# Patient Record
Sex: Female | Born: 1990
Health system: Southern US, Community
[De-identification: ages and names within clinical notes are randomized; demographics above are authoritative.]

## PROBLEM LIST (undated history)

## (undated) DIAGNOSIS — IMO0002 Reserved for concepts with insufficient information to code with codable children: Secondary | ICD-10-CM

## (undated) DIAGNOSIS — R51 Headache: Secondary | ICD-10-CM

## (undated) DIAGNOSIS — T7840XA Allergy, unspecified, initial encounter: Secondary | ICD-10-CM

## (undated) DIAGNOSIS — R87619 Unspecified abnormal cytological findings in specimens from cervix uteri: Secondary | ICD-10-CM

## (undated) HISTORY — DX: Reserved for concepts with insufficient information to code with codable children: IMO0002

## (undated) HISTORY — DX: Unspecified abnormal cytological findings in specimens from cervix uteri: R87.619

## (undated) HISTORY — DX: Allergy, unspecified, initial encounter: T78.40XA

## (undated) HISTORY — PX: WISDOM TOOTH EXTRACTION: SHX21

## (undated) HISTORY — DX: Headache: R51

---
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
## 2009-07-12 ENCOUNTER — Ambulatory Visit: Payer: Self-pay | Admitting: Family Medicine

## 2009-07-12 ENCOUNTER — Encounter: Payer: Self-pay | Admitting: Family Medicine

## 2009-07-12 DIAGNOSIS — R5383 Other fatigue: Secondary | ICD-10-CM

## 2009-07-12 DIAGNOSIS — R5381 Other malaise: Secondary | ICD-10-CM | POA: Insufficient documentation

## 2009-07-16 ENCOUNTER — Encounter (INDEPENDENT_AMBULATORY_CARE_PROVIDER_SITE_OTHER): Payer: Self-pay | Admitting: *Deleted

## 2009-07-16 ENCOUNTER — Ambulatory Visit: Payer: Self-pay | Admitting: Family Medicine

## 2009-07-16 DIAGNOSIS — A5400 Gonococcal infection of lower genitourinary tract, unspecified: Secondary | ICD-10-CM | POA: Insufficient documentation

## 2009-07-16 LAB — CONVERTED CEMR LAB
ALT: 14 units/L (ref 0–35)
AST: 18 units/L (ref 0–37)
Albumin: 4.2 g/dL (ref 3.5–5.2)
Alkaline Phosphatase: 48 units/L (ref 39–117)
BUN: 11 mg/dL (ref 6–23)
Basophils Absolute: 0 10*3/uL (ref 0.0–0.1)
Calcium: 9.4 mg/dL (ref 8.4–10.5)
Chlamydia, DNA Probe: NEGATIVE
Chloride: 106 meq/L (ref 96–112)
Eosinophils Relative: 1 % (ref 0–5)
Folate: 12.2 ng/mL
GC Probe Amp, Genital: POSITIVE — AB
HCT: 38.7 % (ref 36.0–46.0)
HCV Ab: NEGATIVE
Hep A IgM: NEGATIVE
Lymphocytes Relative: 45 % (ref 12–46)
Lymphs Abs: 2.5 10*3/uL (ref 0.7–4.0)
Neutro Abs: 2.5 10*3/uL (ref 1.7–7.7)
Neutrophils Relative %: 46 % (ref 43–77)
Platelets: 302 10*3/uL (ref 150–400)
Potassium: 4.5 meq/L (ref 3.5–5.3)
RDW: 14.1 % (ref 11.5–15.5)
Sodium: 141 meq/L (ref 135–145)
Vitamin B-12: 324 pg/mL (ref 211–911)
WBC: 5.6 10*3/uL (ref 4.0–10.5)

## 2010-04-18 ENCOUNTER — Emergency Department: Payer: Self-pay | Admitting: Emergency Medicine

## 2010-05-20 ENCOUNTER — Emergency Department: Payer: Self-pay | Admitting: Emergency Medicine

## 2010-05-21 ENCOUNTER — Ambulatory Visit: Payer: Self-pay | Admitting: Obstetrics & Gynecology

## 2010-05-21 ENCOUNTER — Inpatient Hospital Stay (HOSPITAL_COMMUNITY)
Admission: AD | Admit: 2010-05-21 | Discharge: 2010-05-21 | Payer: Self-pay | Source: Home / Self Care | Admitting: Obstetrics & Gynecology

## 2010-10-30 ENCOUNTER — Inpatient Hospital Stay (HOSPITAL_COMMUNITY)
Admission: AD | Admit: 2010-10-30 | Discharge: 2010-10-30 | Payer: Self-pay | Source: Home / Self Care | Attending: Obstetrics and Gynecology | Admitting: Obstetrics and Gynecology

## 2010-11-01 ENCOUNTER — Inpatient Hospital Stay (HOSPITAL_COMMUNITY)
Admission: AD | Admit: 2010-11-01 | Discharge: 2010-11-01 | Payer: Self-pay | Source: Home / Self Care | Attending: Obstetrics and Gynecology | Admitting: Obstetrics and Gynecology

## 2010-11-02 ENCOUNTER — Inpatient Hospital Stay (HOSPITAL_COMMUNITY)
Admission: AD | Admit: 2010-11-02 | Discharge: 2010-11-05 | DRG: 372 | Disposition: A | Discharge status: Home / Self Care | Payer: BC Managed Care – PPO | Attending: Obstetrics and Gynecology | Admitting: Obstetrics and Gynecology

## 2010-11-02 LAB — CBC
MCH: 28.8 pg (ref 26.0–34.0)
MCV: 85.6 fL (ref 78.0–100.0)
Platelets: 231 10*3/uL (ref 150–400)
RDW: 14.8 % (ref 11.5–15.5)
WBC: 10.6 10*3/uL — ABNORMAL HIGH (ref 4.0–10.5)

## 2010-11-03 LAB — RPR: RPR Ser Ql: NONREACTIVE

## 2010-11-04 DIAGNOSIS — O9903 Anemia complicating the puerperium: Secondary | ICD-10-CM | POA: Diagnosis not present

## 2010-11-04 DIAGNOSIS — D649 Anemia, unspecified: Secondary | ICD-10-CM | POA: Diagnosis not present

## 2010-11-04 LAB — CBC
MCH: 28.8 pg (ref 26.0–34.0)
MCHC: 33.6 g/dL (ref 30.0–36.0)
Platelets: 211 10*3/uL (ref 150–400)

## 2010-11-04 NOTE — Assessment & Plan Note (Signed)
Summary: CPX W/PAP/RBH   Vital Signs:  Patient Profile:   20 Years Old Female LMP:     04/05/2008 Height:     63 inches Weight:      100.25 pounds Temp:     98.4 degrees F oral Pulse rate:   80 / minute Pulse rhythm:   regular BP sitting:   104 / 68  (left arm) Cuff size:   regular  Vitals Entered By: Delilah Shan (April 19, 2008 11:16 AM)  Menstrual History: LMP (date): 04/05/2008                 Chief Complaint:  CPX with Pap.  History of Present Illness: No pap in the past first sex age 32  on yaz, has helped the menstrual cramps, no SE currently sexually active uses condoms has had less than 3 partners in last year  always has vaginal diacharge before and after menses, somewhat stronger smelling and heavier, no itching no breast lumps and bumps     Current Allergies (reviewed today): No known allergies   Past Medical History:    Reviewed history from 08/02/2007 and no changes required:       allergic diathesis       headaches   Social History:    Reviewed history from 08/02/2007 and no changes required:       lives with mother and sister       no contact with dad       in grade 12th, [employer]       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  CV      Denies chest pains.  Resp      Denies dyspnea at rest and wheezing.  GI      Denies nausea, vomiting, diarrhea, constipation, and abdominal pain.  GU      Denies dysuria.    Problem # 1:  Well Adolescent Exam (ICD-V20.2 Reviewed preventive care protocols, scheduled due services, and updated immunizations.  Vaginal discharge ismost likely  physiologic. Awaiting GC and Chlam results as well as other STD screening. Contiue  YAz.  Problem # 2:  SCREENING EXAMINATION FOR VENEREAL DISEASE (ICD-V74.5)  Orders: T-HIV Antibody  (Reflex) ([id number]) T-Hepatitis Acute Panel (09811-91478) T-RPR (Syphilis) (29562-13086)     ] Current Allergies (reviewed today): No known allergies  Current Medications (including changes made in today's visit):  DRYSOL 20 %  SOLN (ALUMINUM CHLORIDE) AAA qHS YAZ 3-0.02 MG  TABS (DROSPIRENONE-ETHINYL ESTRADIOL) Take 1 tablet by mouth once a day WOMENS HAIR, SKIN & NAILS   TABS (MULTIPLE VITAMINS-MINERALS) Take 1 tablet by mouth once a day    Laboratory Results    Wet [place]/KOH Source: vaginal WBC/hpf 0 Bacteria/hpf 0 Clue cells/hpf 0 Yeast/hpf 0 KOH 0 Trichomonas/hpf 0

## 2010-11-04 NOTE — Assessment & Plan Note (Signed)
Summary: SHOTS AND FORM FILLED OUT/DLO   Vital Signs:  Patient Profile:   20 Years Old Female Height:     63 inches Weight:      101.38 pounds Temp:     97.7 degrees F oral Pulse rate:   80 / minute Pulse rhythm:   regular BP sitting:   92 / 68  (left arm) Cuff size:   regular  Vitals Entered By: Delilah Shan (May 11, 2008 8:38 AM)                 Chief Complaint:  Form filled and ? immunizations.  History of Present Illness: menses stopped yesterday Taking Yaz daily, but interested in Depo shot because she is going away to school sexually active recent negative STD testing  She is starting school at [employer] She is unsure of last immunizations and we do not have a record here    Current Allergies (reviewed today): No known allergies   Past Medical History:    Reviewed history from 08/02/2007 and no changes required:       allergic diathesis       headaches     Physical Exam  General:      Well appearing adolescent,no acute distress Neck:      no carotid bruit or thyromegaly  Lungs:      Clear to ausc, no crackles, rhonchi or wheezing, no grunting, flaring or retractions  Heart:      RRR without murmur  Psychiatric:      alert and cooperative    Review of Systems  General      Denies fever.  CV      Denies chest pains and dyspnea on exertion.  Resp      Denies cough.  GI      Denies nausea, vomiting, diarrhea, constipation, and abdominal pain.  GU      Denies dysuria.  MS      Denies joint pain.  Derm      Denies rash.    Impression & Recommendations:  Problem # 1:  CONTRACEPTIVE MANAGEMENT (ICD-V25.09) 15 min spent counsling on options. U preg negative. Started Depo today.  encouraged condom use for STD prevention.  Orders: Est. Patient Level III (16109) Urine Pregnancy Test  (60454) Depo-Provera 150mg  (J1055) Admin of Therapeutic Inj  intramuscular or subcutaneous (09811)   Problem # 2:  OTH GENERAL MEDICAL  EXAMINATION ADMIN PURPOSES (ICD-V70.3) Assessment: Comment Only NO PE required. We will obtain old records of immunizations.  PPD placed.   Medications Added to Medication List This Visit: 1)  Calcium 600/vitamin D 600-400 Mg-unit Tabs (Calcium carbonate-vitamin d) .Marland Kitchen.. 1 tab by mouth daily  Other Orders: TB Skin Test [phone])    ] Current Allergies (reviewed today): No known allergies  Current Medications (including changes made in today's visit):  DRYSOL 20 %  SOLN (ALUMINUM CHLORIDE) AAA qHS YAZ 3-0.02 MG  TABS (DROSPIRENONE-ETHINYL ESTRADIOL) Take 1 tablet by mouth once a day WOMENS HAIR, SKIN & NAILS   TABS (MULTIPLE VITAMINS-MINERALS) Take 1 tablet by mouth once a day CALCIUM 600/VITAMIN D 600-400 MG-UNIT  TABS (CALCIUM CARBONATE-VITAMIN D) 1 tab by mouth daily   PPD Application    Vaccine Type: PPD    Site: left forearm    Mfr: Sanofi Pasteur    Dose: 0.1 ml    Route: ID    Given by: Delilah Shan    Exp. Date: 02/27/2010    Lot #: [id number]  Laboratory Results   Urine Tests  Date/Time Received: May 11, 2008 9:11 AM  Date/Time Reported: May 11, 2008 9:58 AM     Urine HCG: negative      Medication Administration  Injection # 1:    Medication: Depo-Provera 150mg     Diagnosis: CONTRACEPTIVE MANAGEMENT (ICD-V25.09)    Route: IM    Site: RUOQ gluteus    Exp Date: 09/03/2010    Lot #: [id number]    Mfr: Francisca December    Patient tolerated injection without complications    Given by: Delilah Shan (May 11, 2008 10:05 AM)  Orders Added: 1)  Est. Patient Level III [99213] 2)  Urine Pregnancy Test  [81025] 3)  TB Skin Test [86580] 4)  Depo-Provera 150mg  [J1055] 5)  Admin of Therapeutic Inj  intramuscular or subcutaneous [96372]  Patient was given a card indicating when she should come back. Lugene Fuquay  May 11, 2008 10:06 AM

## 2010-11-04 NOTE — Assessment & Plan Note (Signed)
Summary: ALLERGIES/CLE   Vital Signs:  Patient Profile:   20 Years Old Female Height:     63 inches Weight:      98.25 pounds Temp:     98.5 degrees F oral Pulse rate:   80 / minute Pulse rhythm:   regular BP sitting:   94 / 56  (left arm) Cuff size:   regular  Vitals Entered ByMarland Kitchen Delilah Shan (January 30, 2008 3:21 PM)                 Chief Complaint:  ? Allergies.  History of Present Illness: allergies x few month: headache, occ facial pain, nose sore, sneezing, rhinnorhea, worse when outside, itchy eyes  taking OTC tylenol sinus, never taken OTC antihistamine like claritin no fever  vaginal D/C since after first started menses at age 47 worsening smells sour, clear whitish discharge occurs in between periods no vaginal itching sexually active, since age 73   Acute Pediatric Visit History:         Current Allergies (reviewed today): No known allergies   Past Medical History:    Reviewed history from 08/02/2007 and no changes required:       allergic diathesis       headaches   Social History:    Reviewed history from 08/02/2007 and no changes required:       lives with mother and sister       no contact with dad       in grade 12th, [employer]       plans on 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   Mouth:      Clear without erythema, edema or exudate, mucous membranes moist Lungs:      Clear to ausc, no crackles, rhonchi or wheezing, no grunting, flaring or retractions  Heart:      RRR  without murmur  Cervical nodes:      no cervical or supraclavicular lymphadenopathy    Review of Systems      See HPI    Impression & Recommendations:  Problem # 1:  ALLERGIC RHINITIS DUE TO POLLEN (ICD-477.0) Allergen avoidance. Try OTC Claritin or Zyrtec. NAsa;l saline 3-4 times a day. if not improving follow up. Orders: Est. Patient Level III (16109)   Problem # 2:  Preventive Health Care (ICD-V70.0) Assessment: Comment Only Due for first PAP, as well as STD testing to furthter eval vaginal D/C.    Patient Instructions: 1)  Schedule CPE and PAP (first)    ] Current Allergies (reviewed today): No known allergies  Current Medications (including changes made in today's visit):  DRYSOL 20 %  SOLN (ALUMINUM CHLORIDE) AAA qHS YAZ 3-0.02 MG  TABS (DROSPIRENONE-ETHINYL ESTRADIOL) Take 1 tablet by mouth once a  day WOMENS HAIR, SKIN & NAILS   TABS (MULTIPLE VITAMINS-MINERALS) Take 1 tablet by mouth once a day

## 2010-12-19 LAB — URINALYSIS, ROUTINE W REFLEX MICROSCOPIC
Bilirubin Urine: NEGATIVE
Glucose, UA: NEGATIVE mg/dL
Leukocytes, UA: NEGATIVE
Nitrite: NEGATIVE
Specific Gravity, Urine: 1.025 (ref 1.005–1.030)
pH: 6.5 (ref 5.0–8.0)

## 2010-12-19 LAB — URINE MICROSCOPIC-ADD ON

## 2010-12-19 LAB — GC/CHLAMYDIA PROBE AMP, GENITAL: GC Probe Amp, Genital: NEGATIVE

## 2011-06-15 ENCOUNTER — Encounter: Payer: Self-pay | Admitting: Family Medicine

## 2011-06-16 ENCOUNTER — Encounter: Payer: Self-pay | Admitting: Family Medicine

## 2011-06-16 ENCOUNTER — Ambulatory Visit (INDEPENDENT_AMBULATORY_CARE_PROVIDER_SITE_OTHER): Payer: BC Managed Care – PPO | Admitting: Family Medicine

## 2011-06-16 DIAGNOSIS — M549 Dorsalgia, unspecified: Secondary | ICD-10-CM

## 2011-06-16 DIAGNOSIS — G43009 Migraine without aura, not intractable, without status migrainosus: Secondary | ICD-10-CM

## 2011-06-16 DIAGNOSIS — G43909 Migraine, unspecified, not intractable, without status migrainosus: Secondary | ICD-10-CM

## 2011-06-16 MED ORDER — SUMATRIPTAN SUCCINATE 50 MG PO TABS: 50.0000 mg | ORAL_TABLET | ORAL | Status: DC | PRN

## 2011-06-16 MED ORDER — TIZANIDINE HCL 4 MG PO TABS: 4.0000 mg | ORAL_TABLET | Freq: Every evening | ORAL | Status: AC

## 2011-06-16 MED ORDER — AMITRIPTYLINE HCL 25 MG PO TABS: 25.0000 mg | ORAL_TABLET | Freq: Every day | ORAL | Status: DC

## 2011-06-16 NOTE — Patient Instructions (Signed)
Recheck Dr. Ermalene Searing in 1 month

## 2011-06-16 NOTE — Progress Notes (Signed)
  Subjective:    Patient ID: Erica Contreras, female    DOB: 10-Oct-1990, 20 y.o.   MRN: [id number]  HPI  Erica Contreras, a 20 y.o. female presents today in the office for the following:    Chronic migraines colon. Has been having some migraines for years, light, sound and everything will make it worse.  The symptoms used to be much less notable, and she had much less frequent headaches. Over the last few months, the patient has had virtually daily headaches, notably worse with light and sound sometimes. Sometimes she will be nauseous. She does not describe any distinct aura. Not associated with menses. No recent trauma to the head.  Diffuse neck and back pain, thoracic and lumbar as well as cervical. She describes a prior injury. This is several years ago. Nonfocal to one area. No nerve symptoms. No tingling or numbness. No weakness.  The PMH, PSH, Social History, Family History, Medications, and allergies have been reviewed in [facility], and have been updated if relevant.   Review of Systems ROS: GEN: No acute illnesses, no fevers, chills. GI: No n/v/d, eating normally Pulm: No SOB Interactive and getting along well at home.  Otherwise, ROS is as per the HPI.     Objective:   Physical Exam   Physical Exam  Blood pressure 100/62, pulse 75, temperature 98.6 F (37 C), temperature source Oral, height 5\' 2"  (1.575 m), weight 121 lb 6.4 oz (55.067 kg), SpO2 97.00%.  GEN: WDWN, NAD, Non-toxic, A & O x 3 HEENT: Atraumatic, Normocephalic. Neck supple. No masses, No LAD. Ears and Nose: No external deformity. CV: RRR, No M/G/R. No JVD. No thrill. No extra heart sounds. PULM: CTA B, no wheezes, crackles, rhonchi. No retractions. No resp. distress. No accessory muscle use. EXTR: No c/c/e NEURO Normal gait.  PSYCH: Normally interactive. Conversant. Not depressed or anxious appearing.  Calm demeanor.  MSK: Full range of motion at the lumbar spine with flexion, extension, lateral bending and  rotation. Full range of motion at the cervical spine. Full range of motion of bilateral shoulders. C5-T1 is intact. L4-S1 is intact. Strength is preserved. Sensation is preserved throughout.  Neurovascularly intact throughout.      Assessment & Plan:   1. Migraine  amitriptyline (ELAVIL) 25 MG tablet, SUMAtriptan (IMITREX) 50 MG tablet  2. Back pain  tiZANidine (ZANAFLEX) 4 MG tablet    Poorly controlled chronic migraines. Start with low-dose Elavil for migraine prophylaxis. Trial of Imitrex for acute migraine.  Diffuse neck, thoracic, and lumbar spine pain without any focal origin. Confusing picture. No neurological compromise. I encouraged the patient to exercise, and reviewed some basic neck and back stability exercises. Some Zanaflex at night would be reasonable.

## 2011-06-23 ENCOUNTER — Encounter: Payer: Self-pay | Admitting: Family Medicine

## 2011-06-23 ENCOUNTER — Ambulatory Visit (INDEPENDENT_AMBULATORY_CARE_PROVIDER_SITE_OTHER): Payer: BC Managed Care – PPO | Admitting: Family Medicine

## 2011-06-23 VITALS — BP 90/60 | Temp 99.5°F | Wt 122.1 lb

## 2011-06-23 DIAGNOSIS — J111 Influenza due to unidentified influenza virus with other respiratory manifestations: Secondary | ICD-10-CM

## 2011-06-23 DIAGNOSIS — Z0279 Encounter for issue of other medical certificate: Secondary | ICD-10-CM

## 2011-06-23 MED ORDER — OSELTAMIVIR PHOSPHATE 75 MG PO CAPS: 75.0000 mg | ORAL_CAPSULE | Freq: Two times a day (BID) | ORAL | Status: AC

## 2011-06-23 MED ORDER — HYDROCODONE-HOMATROPINE 5-1.5 MG/5ML PO SYRP: ORAL_SOLUTION | ORAL | Status: AC

## 2011-06-23 NOTE — Progress Notes (Signed)
  Subjective:    Patient ID: Erica Contreras, female    DOB: 31-Mar-1991, 20 y.o.   MRN: [id number]  HPI  Erica Contreras, a 20 y.o. female presents today in the office for the following:    Fever, chills, sweats. Started last night. Achy "on skin" Bad cough. N/v/ Some emesis. Generally feels terrible.  Felt feverish all night, cold chills Lying on exam room when i enter the room  Productive cough No sig runny nose No rash  The PMH, PSH, Social History, Family History, Medications, and allergies have been reviewed in [facility], and have been updated if relevant.   Review of Systems ROS: GEN: Acute illness details above GI: Tolerating PO intake GU: maintaining adequate hydration and urination Pulm: No SOB Interactive and getting along well at home.  Otherwise, ROS is as per the HPI.     Objective:   Physical Exam   Physical Exam  Blood pressure 90/60, temperature 99.5 F (37.5 C), temperature source Oral, weight 122 lb 1.9 oz (55.393 kg).  Gen: WDWN, NAD; A & O x3, cooperative. Pleasant.Globally Non-toxic HEENT: Normocephalic and atraumatic. Throat clear, w/o exudate, R TM clear, L TM - good landmarks, No fluid present. rhinnorhea. No frontal or maxillary sinus T. MMM NECK: Anterior cervical  LAD is shotty CV: RRR, No M/G/R, cap refill <2 sec PULM: Breathing comfortably in no respiratory distress. no wheezing, crackles, rhonchi ABD: S,NT,ND,+BS. No HSM. No rebound. EXT: No c/c/e PSYCH: Friendly, good eye contact MSK: Nml gait       Assessment & Plan:   1. Influenza with respiratory manifestation other than pneumonia  oseltamivir (TAMIFLU) 75 MG capsule, HYDROcodone-homatropine (HYCODAN) 5-1.5 MG/5ML syrup     Refer to the patient instructions sections for details of plan shared with patient.   Concern for influenza

## 2011-06-23 NOTE — Patient Instructions (Signed)
INFLUENZA Viral illness: High fever, headache, bad cough, body aches, sore throat, sometimes nausea, vomitting, diarrhea.  Usually quick onset  TREATMENT 1. Decongestant: Sudafed (NOT IF HIGH BLOOD PRESSURE) 2. Nose Sprays: Saline nasal spray, Can use Afrin or Neosynephrine, but no more than 3 days 3. Cough Suppressants: DM portion of cough med, or Strong prescription (at night) - the cough syrup i gave you During day 4. Expectorant: Liquify Secretions (Guaifenesin) 5. Example: Mucinex (expectorant) or Mucinex-D (expectorant and decongestant) 6. Take all prescribed meds 7. Anti-virals may be used if caught early or in high risk people. (Do NOT if pregnant, breast feeding, seizure disorder) 8. Rest helpful, but move some during day 9. Breathe moist air: Humidifier, Vaporizer, or steam from shower 10. No work or school until no fever for 24 hrs WITH NO Tylenol or Ibuprofen. 11. Pneumonia on top of Flu is possible, if you are doing poorly particularly if a smoker or have COPD, please let us know. 12. Wash hands and cover mouth with cough

## 2011-06-24 ENCOUNTER — Telehealth: Payer: Self-pay | Admitting: *Deleted

## 2011-06-24 NOTE — Telephone Encounter (Signed)
We have received disability forms from [facility] for the patient.  She says she has the flu and is missing work.  She needs these completed and faxed back to [employer].  Forms are on your desk.

## 2011-06-24 NOTE — Telephone Encounter (Signed)
Will complete 

## 2011-07-23 ENCOUNTER — Ambulatory Visit (INDEPENDENT_AMBULATORY_CARE_PROVIDER_SITE_OTHER): Payer: BC Managed Care – PPO | Admitting: Family Medicine

## 2011-07-23 ENCOUNTER — Encounter: Payer: Self-pay | Admitting: Family Medicine

## 2011-07-23 VITALS — BP 100/72 | HR 78 | Temp 98.8°F | Ht 63.0 in | Wt 125.4 lb

## 2011-07-23 DIAGNOSIS — R350 Frequency of micturition: Secondary | ICD-10-CM

## 2011-07-23 DIAGNOSIS — G43909 Migraine, unspecified, not intractable, without status migrainosus: Secondary | ICD-10-CM | POA: Insufficient documentation

## 2011-07-23 DIAGNOSIS — M549 Dorsalgia, unspecified: Secondary | ICD-10-CM

## 2011-07-23 LAB — POCT URINALYSIS DIPSTICK
Bilirubin, UA: NEGATIVE
Ketones, UA: NEGATIVE
Leukocytes, UA: NEGATIVE

## 2011-07-23 MED ORDER — DICLOFENAC SODIUM 75 MG PO TBEC: DELAYED_RELEASE_TABLET | ORAL | Status: DC

## 2011-07-23 MED ORDER — TOPIRAMATE 50 MG PO TABS: 50.0000 mg | ORAL_TABLET | Freq: Every day | ORAL | Status: DC

## 2011-07-23 NOTE — Patient Instructions (Signed)
Stop all chocolate, caffeine, soda, citris, tomato and spicy foods.  Push fluids earlier in the day, but keep up with water throughout the day.   For migraine Keep a migraine diary. Look at migraine trigger list. Stress reduction, exercise regularly, 8 hours of sleep at night,  Eating 3 meals a day, getting adequate water. Stop all over the counter medications. Start topamax daily at bedtime Use diclofenac 75 mg for  severe headache pain as well as for back pain...limit use stop if nausea or stomach upset.  Referral to PT for treatment of upper back pain. Consider massage for upper back. Follow up in 1 month.

## 2011-07-23 NOTE — Progress Notes (Signed)
Subjective:    Patient ID: Erica Contreras, female    DOB: 03/22/1991, 20 y.o.   MRN: [id number]  Urinary Tract Infection  This is a new problem. The current episode started 1 to 4 weeks ago. The problem occurs intermittently. Quality: no pain just frequency in last few weeks.  There has been no fever. Associated symptoms include frequency. Pertinent negatives include no chills, flank pain, nausea, urgency or vomiting.   No recent changes in diet, has been decreasing soda. Waking up in middle of night to urinate and urinates 8-9 times a day. Has normal flow, but never feels fully empty. Drinking one cup of tea every now and then. Does eat chocolate, no citris, no spicy or tomato.  20year old female seen one month ago by Dr. Salena Saner for migraines and neck pain.  DX with Poorly controlled chronic migraines. Started low-dose Elavil for migraine prophylaxis. Trial of Imitrex for acute migraine. She was unable to take elavil.. Caused nausea and headache to worsen. Took for 2 -3 weeks.  Headaches are still occuring everyday. Some days a lot worse than others. Using ibuprofen and tylenol everyday. She feels like imitrex made migraine worse instantly.  Has history of migraines. Never been on any other med for migraines.  Back and neck pain... Neck pain has improved. She is doing stretching exercises Dr. Salena Saner recommended. Neck pain better but still with upper thoracic back pain. She was also given tizanidine for muscle relaxant.    Review of Systems  Constitutional: Negative for chills.  HENT: Negative for ear pain.   Eyes: Negative for pain.  Gastrointestinal: Negative for nausea and vomiting.  Genitourinary: Positive for frequency. Negative for urgency and flank pain.       Objective:   Physical Exam  Constitutional: She is oriented to person, place, and time. Vital signs are normal. She appears well-developed and well-nourished. She is cooperative.  Non-toxic appearance. She does not appear  ill. No distress.  HENT:  Head: Normocephalic.  Right Ear: Hearing, tympanic membrane, external ear and ear canal normal. Tympanic membrane is not erythematous, not retracted and not bulging.  Left Ear: Hearing, tympanic membrane, external ear and ear canal normal. Tympanic membrane is not erythematous, not retracted and not bulging.  Nose: No mucosal edema or rhinorrhea. Right sinus exhibits no maxillary sinus tenderness and no frontal sinus tenderness. Left sinus exhibits no maxillary sinus tenderness and no frontal sinus tenderness.  Mouth/Throat: Uvula is midline, oropharynx is clear and moist and mucous membranes are normal.  Eyes: Conjunctivae, EOM and lids are normal. Pupils are equal, round, and reactive to light. No foreign bodies found.  Neck: Trachea normal and normal range of motion. Neck supple. Carotid bruit is not present. No mass and no thyromegaly present.  Cardiovascular: Normal rate, regular rhythm, S1 normal, S2 normal, normal heart sounds, intact distal pulses and normal pulses.  Exam reveals no gallop and no friction rub.   No murmur heard. Pulmonary/Chest: Effort normal and breath sounds normal. Not tachypneic. No respiratory distress. She has no decreased breath sounds. She has no wheezes. She has no rhonchi. She has no rales.  Abdominal: Soft. Normal appearance and bowel sounds are normal. There is no tenderness.  Musculoskeletal:       Thoracic back: She exhibits tenderness. She exhibits normal range of motion and no bony tenderness.  Neurological: She is alert and oriented to person, place, and time. She has normal strength and normal reflexes. No cranial nerve deficit or sensory deficit. She  exhibits normal muscle tone. She displays a negative Romberg sign. Gait normal.  Skin: Skin is warm, dry and intact. No rash noted.  Psychiatric: Her speech is normal and behavior is normal. Judgment and thought content normal. Her mood appears not anxious. Cognition and memory are  normal. She does not exhibit a depressed mood.          Assessment & Plan:

## 2011-07-24 ENCOUNTER — Other Ambulatory Visit: Payer: BC Managed Care – PPO

## 2011-07-27 ENCOUNTER — Other Ambulatory Visit: Payer: Self-pay | Admitting: Family Medicine

## 2011-08-08 NOTE — Assessment & Plan Note (Signed)
Referral to PT for treatment of upper back pain. Consider massage for upper back. Follow up in 1 month.

## 2011-08-08 NOTE — Assessment & Plan Note (Signed)
UA reviewed.. No sign of infection.  Likely due to bladder spasm/irritation. Stop all chocolate, caffeine, soda, citris, tomato and spicy foods.  Push fluids earlier in the day, but keep up with water throughout the day.

## 2011-08-08 NOTE — Assessment & Plan Note (Signed)
Keep a migraine diary. Look at migraine trigger list. Stress reduction, exercise regularly, 8 hours of sleep at night,  Eating 3 meals a day, getting adequate water. Stop all over the counter medications. Start topamax daily at bedtime Use diclofenac 75 mg for  severe headache pain as well as for back pain...limit use stop if nausea or stomach upset.

## 2011-08-31 ENCOUNTER — Encounter: Payer: Self-pay | Admitting: Family Medicine

## 2011-08-31 ENCOUNTER — Ambulatory Visit: Payer: BC Managed Care – PPO | Admitting: Family Medicine

## 2011-08-31 ENCOUNTER — Ambulatory Visit (INDEPENDENT_AMBULATORY_CARE_PROVIDER_SITE_OTHER): Payer: BC Managed Care – PPO | Admitting: Family Medicine

## 2011-08-31 DIAGNOSIS — G43909 Migraine, unspecified, not intractable, without status migrainosus: Secondary | ICD-10-CM

## 2011-08-31 DIAGNOSIS — F411 Generalized anxiety disorder: Secondary | ICD-10-CM | POA: Insufficient documentation

## 2011-08-31 DIAGNOSIS — F32A Depression, unspecified: Secondary | ICD-10-CM

## 2011-08-31 DIAGNOSIS — F410 Panic disorder [episodic paroxysmal anxiety] without agoraphobia: Secondary | ICD-10-CM

## 2011-08-31 DIAGNOSIS — F329 Major depressive disorder, single episode, unspecified: Secondary | ICD-10-CM

## 2011-08-31 DIAGNOSIS — M549 Dorsalgia, unspecified: Secondary | ICD-10-CM

## 2011-08-31 NOTE — Progress Notes (Signed)
Subjective:    Patient ID: Erica Contreras, female    DOB: 05-12-1991, 20 y.o.   MRN: [id number]  HPI  20 year old female seen 1 month ago for migraine and neck upper back pain.  Migraine - At last OV recs made included: Keep a migraine diary. Look at migraine trigger list.  Stress reduction, exercise regularly, 8 hours of sleep at night, Eating 3 meals a day, getting adequate water.  Stop all over the counter medications.  Start topamax daily at bedtime  Use diclofenac 75 mg for severe headache pain as well as for back pain...limit use stop if nausea or stomach upset.   She has now been on topamax 50 mg daily for 1 month. No SE. She reports that migraines occuring less frequently. Stress is a big trigger for her.. Migraines now only happening at work. Diclofenac helped with severe headache.. Has only had to use 2-3 times.  She report significant panic attacks...occuring every few weeks.  Trouble sleeping again.  She feels depressed in last 1- 2 months. Has not been on antidepressant. Has never seen a Veterinary surgeon. Crying frequently. Upset about leaving son, not seeing him a lot, hates work.  BACK PAIN, UPPER -  PT for treatment of upper back pain.  Consider massage for upper back.  Today she reports some improvement but not significant.        Review of Systems  Constitutional: Positive for fatigue. Negative for fever.  HENT: Negative for ear pain.   Eyes: Negative for redness.  Respiratory: Negative for cough and shortness of breath.   Cardiovascular: Negative for chest pain.       Objective:   Physical Exam  Constitutional: She is oriented to person, place, and time. Vital signs are normal. She appears well-developed and well-nourished. She is cooperative.  Non-toxic appearance. She does not appear ill. No distress.  HENT:  Head: Normocephalic.  Right Ear: Hearing, tympanic membrane, external ear and ear canal normal. Tympanic membrane is not erythematous, not  retracted and not bulging.  Left Ear: Hearing, tympanic membrane, external ear and ear canal normal. Tympanic membrane is not erythematous, not retracted and not bulging.  Nose: No mucosal edema or rhinorrhea. Right sinus exhibits no maxillary sinus tenderness and no frontal sinus tenderness. Left sinus exhibits no maxillary sinus tenderness and no frontal sinus tenderness.  Mouth/Throat: Uvula is midline, oropharynx is clear and moist and mucous membranes are normal.  Eyes: Conjunctivae, EOM and lids are normal. Pupils are equal, round, and reactive to light. No foreign bodies found.  Neck: Trachea normal and normal range of motion. Neck supple. Carotid bruit is not present. No mass and no thyromegaly present.  Cardiovascular: Normal rate, regular rhythm, S1 normal, S2 normal, normal heart sounds, intact distal pulses and normal pulses.  Exam reveals no gallop and no friction rub.   No murmur heard. Pulmonary/Chest: Effort normal and breath sounds normal. Not tachypneic. No respiratory distress. She has no decreased breath sounds. She has no wheezes. She has no rhonchi. She has no rales.  Abdominal: Soft. Normal appearance and bowel sounds are normal. There is no tenderness.  Musculoskeletal:       Cervical back: She exhibits no bony tenderness.       ttp over B trapezius muscle  Neurological: She is alert and oriented to person, place, and time. She has normal strength and normal reflexes. No cranial nerve deficit or sensory deficit.  Skin: Skin is warm, dry and intact. No rash noted.  Psychiatric: Her speech is normal. Judgment and thought content normal. Her mood appears not anxious. Her affect is blunt. She is slowed and withdrawn. Cognition and memory are normal. She does not exhibit a depressed mood.          Assessment & Plan:

## 2011-08-31 NOTE — Assessment & Plan Note (Signed)
Continue PT. Work on stress reduction. Try yoga.

## 2011-08-31 NOTE — Patient Instructions (Addendum)
Stop at the front desk for referral. Continue current dose of topamax... Call if interested in increasing to 100 mg daily....ie. If not less migraine with decrease in stress and improvement. Use diclofenac as needed for severe migraine.. Consider doing more Yoga. Either schedule CPX or follow up migraine and mood in 2-3 months.

## 2011-08-31 NOTE — Assessment & Plan Note (Signed)
Ongoing in short term... Will begin with counseling referral.. Stress reduction and relaxation.  She will work on thinking more positively about work. Not interested in medication at this time. Follow up in next few months. Call sooner if symptoms worsening.

## 2011-08-31 NOTE — Assessment & Plan Note (Signed)
Improved control on topamax but severe migraine still occuring 2-3 per month. Offered topamax increase... Ut given migraine really occuring related to stress and mood will treat this first.  Acute migraine treatment with diclofenac effective.

## 2011-09-23 ENCOUNTER — Ambulatory Visit: Payer: BC Managed Care – PPO | Admitting: Psychology

## 2011-11-05 ENCOUNTER — Ambulatory Visit: Payer: BC Managed Care – PPO | Admitting: Family Medicine

## 2011-11-06 ENCOUNTER — Ambulatory Visit: Payer: BC Managed Care – PPO | Admitting: Family Medicine

## 2012-02-11 ENCOUNTER — Ambulatory Visit: Payer: BC Managed Care – PPO | Admitting: Family Medicine

## 2012-02-11 DIAGNOSIS — Z0289 Encounter for other administrative examinations: Secondary | ICD-10-CM

## 2012-02-22 ENCOUNTER — Encounter: Payer: Self-pay | Admitting: Obstetrics and Gynecology

## 2012-02-22 DIAGNOSIS — D573 Sickle-cell trait: Secondary | ICD-10-CM | POA: Insufficient documentation

## 2012-02-22 DIAGNOSIS — IMO0002 Reserved for concepts with insufficient information to code with codable children: Secondary | ICD-10-CM | POA: Insufficient documentation

## 2012-02-22 DIAGNOSIS — Z8619 Personal history of other infectious and parasitic diseases: Secondary | ICD-10-CM

## 2012-02-22 HISTORY — DX: Personal history of other infectious and parasitic diseases: Z86.19

## 2012-02-23 ENCOUNTER — Ambulatory Visit: Payer: Self-pay | Admitting: Obstetrics and Gynecology

## 2012-02-24 ENCOUNTER — Encounter: Payer: Self-pay | Admitting: *Deleted

## 2012-02-24 ENCOUNTER — Ambulatory Visit (INDEPENDENT_AMBULATORY_CARE_PROVIDER_SITE_OTHER): Payer: 59 | Admitting: Family Medicine

## 2012-02-24 ENCOUNTER — Encounter: Payer: Self-pay | Admitting: Family Medicine

## 2012-02-24 VITALS — BP 100/60 | HR 90 | Temp 98.8°F | Ht 63.0 in | Wt 133.1 lb

## 2012-02-24 DIAGNOSIS — J029 Acute pharyngitis, unspecified: Secondary | ICD-10-CM

## 2012-02-24 DIAGNOSIS — K121 Other forms of stomatitis: Secondary | ICD-10-CM

## 2012-02-24 DIAGNOSIS — K1370 Unspecified lesions of oral mucosa: Secondary | ICD-10-CM

## 2012-02-24 DIAGNOSIS — J020 Streptococcal pharyngitis: Secondary | ICD-10-CM

## 2012-02-24 LAB — POCT RAPID STREP A (OFFICE): Rapid Strep A Screen: POSITIVE — AB

## 2012-02-24 MED ORDER — MAGIC MOUTHWASH W/LIDOCAINE: 5.0000 mL | Freq: Four times a day (QID) | ORAL | Status: DC | PRN

## 2012-02-24 MED ORDER — PENICILLIN V POTASSIUM 500 MG PO TABS: 500.0000 mg | ORAL_TABLET | Freq: Three times a day (TID) | ORAL | Status: AC

## 2012-02-24 NOTE — Progress Notes (Signed)
  Patient Name: Erica Contreras Date of Birth: [birth year] Age: 21 y.o. Medical Record Number: [id number] Gender: female Date of Encounter: 02/24/2012  History of Present Illness:  Erica Contreras is a 21 y.o. very pleasant female patient who presents with the following:  HFM / other virus, few days, ulcers mouth, tongue - painful, very painful to swallow. Has had some intermittent fever. No rash, no rash on palms or soles. No new sexual contacts or exposures. Son had virus, oral sores last week, shared a drink with him.  Past Medical History, Surgical History, Social History, Family History, Problem List, Medications, and Allergies have been reviewed and updated if relevant.  Review of Systems: ROS: GEN: Acute illness details above GI: Tolerating PO intake GU: maintaining adequate hydration and urination Pulm: No SOB Interactive and getting along well at home.  Otherwise, ROS is as per the HPI.   Physical Examination: Filed Vitals:   02/24/12 1010  BP: 100/60  Pulse: 90  Temp: 98.8 F (37.1 C)  TempSrc: Oral  Height: 5\' 3"  (1.6 m)  Weight: 133 lb 1.9 oz (60.383 kg)  SpO2: 98%    Body mass index is 23.58 kg/(m^2).   Gen: WDWN, NAD; A & O x3, cooperative. Pleasant.Globally Non-toxic HEENT: Normocephalic and atraumatic. Throat: swollen tonsills with no exudate, ulcer on R tonsil R TM clear, L TM - good landmarks, No fluid present. No hinnorhea. No frontal or maxillary sinus T. MMM. Ulcer few on tongue, L cheek NECK: Anterior cervical  LAD is present - TTP CV: RRR, No M/G/R, cap refill <2 sec PULM: Breathing comfortably in no respiratory distress. no wheezing, crackles, rhonchi ABD: S,NT,ND,+BS. No HSM. No rebound. EXT: No c/c/e PSYCH: Friendly, good eye contact   Assessment and Plan:  1. Strep throat    2. Sore throat  POCT rapid strep A  3. Oral ulcer     Strep and also suspect has viral syndrome such as HFM or other with oral ulcers Pen VK MM for symtomatic  relief  Rapid Strep A Screen  Date Value Range Status  02/24/2012 Positive* Negative (no units) Final     Orders Today: Orders Placed This Encounter  Procedures  . POCT rapid strep A    Medications Today: Meds ordered this encounter  Medications  . Alum & Mag Hydroxide-Simeth (MAGIC MOUTHWASH W/LIDOCAINE) SOLN    Sig: Take 5 mLs by mouth 4 (four) times daily as needed.    Dispense:  240 mL    Refill:  0  . penicillin v potassium (VEETID) 500 MG tablet    Sig: Take 1 tablet (500 mg total) by mouth 3 (three) times daily.    Dispense:  30 tablet    Refill:  0

## 2012-04-19 ENCOUNTER — Encounter: Payer: Self-pay | Admitting: Family Medicine

## 2012-12-12 ENCOUNTER — Encounter: Payer: Self-pay | Admitting: Family Medicine

## 2012-12-12 ENCOUNTER — Ambulatory Visit (INDEPENDENT_AMBULATORY_CARE_PROVIDER_SITE_OTHER): Payer: 59 | Admitting: Family Medicine

## 2012-12-12 ENCOUNTER — Encounter: Payer: Self-pay | Admitting: *Deleted

## 2012-12-12 VITALS — BP 100/70 | HR 118 | Temp 102.5°F | Ht 63.0 in | Wt 133.5 lb

## 2012-12-12 DIAGNOSIS — E860 Dehydration: Secondary | ICD-10-CM

## 2012-12-12 DIAGNOSIS — B349 Viral infection, unspecified: Secondary | ICD-10-CM

## 2012-12-12 DIAGNOSIS — B9789 Other viral agents as the cause of diseases classified elsewhere: Secondary | ICD-10-CM

## 2012-12-12 MED ORDER — ONDANSETRON 8 MG PO TBDP: 8.0000 mg | ORAL_TABLET | Freq: Three times a day (TID) | ORAL | Status: DC | PRN

## 2012-12-12 NOTE — Progress Notes (Signed)
Nature conservation officer at [facility] [address] [place] [address] Phone: [phone] Fax: [phone]  Date:  12/12/2012   Name:  Erica Contreras   DOB:  [birth year]   MRN:  [id number] Gender: female Age: 22 y.o.  Primary Physician:  Kerby Nora, MD  Evaluating MD: Hannah Beat, MD   Chief Complaint: fever and chills, Nausea, Diarrhea and general achy all over   History of Present Illness:  Erica Contreras is a 22 y.o. pleasant patient who presents with the following:  Moderately ill-appearing female who is generally quite healthy who presents in the office with a temperature of 102.5, and has been febrile for 2 days presents with severe aching, diarrhea, nausea, she is not throwing up. She has had significant nausea. She has had diarrhea twice.  She does not have any upper respiratory complaints. Does feel like she is aching, similar to sensation when having the flu.  Illness: Diarrhea twice, some nausea Achy  102.5 Dry-heaving.     Patient Active Problem List  Diagnosis  . ALLERGIC RHINITIS DUE TO POLLEN  . DYSMENORRHEA  . CONTRACTURE, JOINT, HAND  . BACK PAIN, UPPER  . HYPERHIDROSIS  . Migraine  . Panic attack  . Depression  . Hx of gonorrhea  . Sickle cell trait  . Low BMI  . Hx of chlamydia infection    Past Medical History  Diagnosis Date  . Allergy   . Headache   . Abnormal Pap smear     Past Surgical History  Procedure Laterality Date  . Wisdom tooth extraction      History   Social History  . Marital Status: Single    Spouse Name: N/A    Number of Children: 1  . Years of Education: N/A   Occupational History  .     Social History Main Topics  . Smoking status: Never Smoker   . Smokeless tobacco: Not on file  . Alcohol Use: No  . Drug Use: No  . Sexually Active: Not on file   Other Topics Concern  . 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 Mitral valve prolapse Mother   . Allergies Mother   . Hypertension Mother   . Alcohol abuse Father   . Hypertension Father   . Allergies Sister   . Asthma Sister   . Heart disease Maternal Grandmother     rheumatic heart disease  . Hypertension Maternal Grandmother   . Hypertension Paternal Grandmother   . Diabetes Maternal Aunt   . Kidney cancer Maternal Aunt   . Hypertension Paternal Aunt   . Deep vein thrombosis Paternal Aunt     No Known Allergies  Medication list has been reviewed and updated.  Outpatient Prescriptions Prior to Visit  Medication Sig Dispense Refill  . levonorgestrel (MIRENA) 20 MCG/24HR IUD 1 each by Intrauterine route once.        . Alum & Mag Hydroxide-Simeth (MAGIC MOUTHWASH W/LIDOCAINE) SOLN Take 5 mLs by mouth 4 (four)  times daily as needed.  240 mL  0   No facility-administered medications prior to visit.    Review of Systems:  Acute illness as above. Fever. Achiness. Fatigue. No cough. Significant nausea.  Physical Examination: BP 100/70  Pulse 118  Temp(Src) 102.5 F (39.2 C) (Oral)  Ht 5\' 3"  (1.6 m)  Wt 133 lb 8 oz (60.555 kg)  BMI 23.65 kg/m2  SpO2 98%  Ideal Body Weight: Weight in (lb) to have BMI = 25: 140.8  GEN: WDWN, NAD, mildly ill appearing, A & O x 3 HEENT: Atraumatic, Normocephalic. Neck supple. No masses, No LAD. Ears and Nose: No external deformity. CV: tachycardic to 110, No M/G/R. No JVD. No thrill. No extra heart sounds. PULM: CTA B, no wheezes, crackles, rhonchi. No retractions. No resp. distress. No accessory muscle use. ABD: S, mild nonfocal tenderness, ND, hyperactive BS. No rebound. No HSM. EXTR: No c/c/e NEURO Normal gait.   PSYCH: Normally interactive. Conversant. Not depressed or anxious appearing.  Calm demeanor.    Assessment and Plan:  Viral syndrome  Moderate dehydration  Push fluids for now - zofran prn.  O/w healthy young patient - should resolve with time, but cont. Supportive care  Orders Today:  No orders of the defined types were placed in this encounter.    Updated Medication List: (Includes new medications, updates to list, dose adjustments) Meds ordered this encounter  Medications  . ondansetron (ZOFRAN-ODT) 8 MG disintegrating tablet    Sig: Take 1 tablet (8 mg total) by mouth every 8 (eight) hours as needed for nausea.    Dispense:  20 tablet    Refill:  0    Medications Discontinued: There are no discontinued medications.    Signed, Elpidio Galea. Copland, MD 12/12/2012 2:50 PM

## 2012-12-14 ENCOUNTER — Telehealth: Payer: Self-pay | Admitting: Family Medicine

## 2012-12-14 NOTE — Telephone Encounter (Signed)
Completed.  Also spoke to the patient. Feels bad - no po intake, no fluids, feels lightheaded and dizzy. Like going to pass out. With mod dehydration on Mon, more concerning - asked to go for eval. Suggested   Hannah Beat, MD 12/14/2012, 5:36 PM

## 2012-12-14 NOTE — Telephone Encounter (Signed)
FMLA forms are on your desk.-  See note below. Insurance company is also asking for copy of office note for short term disability claim.

## 2012-12-14 NOTE — Telephone Encounter (Signed)
Suggested [facility] / [place]

## 2012-12-14 NOTE — Telephone Encounter (Signed)
Saw you Monday for a virus. 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 She still has a diarrhea and a fever and needs the work note extended. She is very weak. She works for [employer] and they have faxed over here Short Term Disability forms for you to fill out and they told her that they mailed her the papers as well. Can you fill them out and also write her out of work until at least Wednesday. She says that if the virus goes away she will go back to work but feels really bad and they need the paperwork from a Dr. Call the patient please if you can do this and also if you received the [employer] Term paperwork. [phone]

## 2012-12-20 ENCOUNTER — Encounter: Payer: Self-pay | Admitting: *Deleted

## 2013-01-23 ENCOUNTER — Ambulatory Visit (INDEPENDENT_AMBULATORY_CARE_PROVIDER_SITE_OTHER): Payer: 59 | Admitting: Family Medicine

## 2013-01-23 ENCOUNTER — Encounter: Payer: Self-pay | Admitting: Family Medicine

## 2013-01-23 VITALS — BP 100/68 | HR 72 | Temp 99.0°F | Wt 132.5 lb

## 2013-01-23 DIAGNOSIS — H01004 Unspecified blepharitis left upper eyelid: Secondary | ICD-10-CM | POA: Insufficient documentation

## 2013-01-23 DIAGNOSIS — H01009 Unspecified blepharitis unspecified eye, unspecified eyelid: Secondary | ICD-10-CM

## 2013-01-23 MED ORDER — ERYTHROMYCIN 5 MG/GM OP OINT: TOPICAL_OINTMENT | Freq: Three times a day (TID) | OPHTHALMIC | Status: DC

## 2013-01-23 NOTE — Patient Instructions (Addendum)
You have recurrent inflammation of left upper eyelid. Treat with warm compresses, ibuprofen 400mg  twice daily for next 3 days, and start topical antibiotic sent to pharmacy.  Massage eyelid as tolerated (after washing hands) No contacts. If not improving with above over next few days, or any worsening, see eye doctor.  Let us know if you need referral Good to see you today, call us with questions.  Blepharitis Blepharitis is redness, soreness, and swelling (inflammation) of one or both eyelids. It may be caused by an allergic reaction or a bacterial infection. Blepharitis may also be associated with reddened, scaly skin (seborrhea) of the scalp and eyebrows. While you sleep, eye discharge may cause your eyelashes to stick together. Your eyelids may itch, burn, swell, and may lose their lashes. These will grow back. Your eyes may become sensitive. Blepharitis may recur and need repeated treatment. If this is the case, you may require further evaluation by an eye specialist (ophthalmologist). HOME CARE INSTRUCTIONS   Keep your hands clean.  Use a clean towel each time you dry your eyelids. Do not use this towel to clean other areas. Do not share a towel or makeup with anyone.  Wash your eyelids with warm water or warm water mixed with a small amount of baby shampoo. Do this twice a day or as often as needed.  Wash your face and eyebrows at least once a day.  Use warm compresses 2 times a day for 10 minutes at a time, or as directed by your caregiver.  Apply antibiotic ointment as directed by your caregiver.  Avoid rubbing your eyes.  Avoid wearing makeup until you get better.  Follow up with your caregiver as directed. SEEK IMMEDIATE MEDICAL CARE IF:   You have pain, redness, or swelling that gets worse or spreads to other parts of your face.  Your vision changes, or you have pain when looking at lights or moving objects.  You have a fever.  Your symptoms continue for longer than 2  to 4 days or become worse. MAKE SURE YOU:   Understand these instructions.  Will watch your condition.  Will get help right away if you are not doing well or get worse. Document Released: 09/18/2000 Document Revised: 12/14/2011 Document Reviewed: 10/29/2010 [facility] Patient Information 2013 [place], [place].

## 2013-01-23 NOTE — Progress Notes (Signed)
  Subjective:    Patient ID: Erica Contreras, female    DOB: 03/30/1991, 22 y.o.   MRN: [id number]  HPI CC: L eye swelling  5-6d h/o L eye swelling   Has been using warm compresses on eye which has helpd but has continued. Has tried ibuprofen as well - only temporary relief   Currently having allergy flare as well - with sneezing, itchy/watery eyes.  Also with headache.  Denies fevers. Currently wearing contacts.  Last saw eye doctor early 2014. Has had styes in past, several years ago, needed med for this as well as drainage.  Past Medical History  Diagnosis Date  . Allergy   . Headache   . Abnormal Pap smear      Review of Systems Per HPI    Objective:   Physical Exam  Nursing note and vitals reviewed. Constitutional: She appears well-developed and well-nourished. No distress.  Eyes: Conjunctivae and EOM are normal. Pupils are equal, round, and reactive to light.    Left upper eyelid erythematous and swollen.  Several eyelash follicles erythematous, mild induration. Inner eyelid not affected  Lymphadenopathy:       Head (right side): No preauricular, no posterior auricular and no occipital adenopathy present.       Head (left side): No preauricular, no posterior auricular and no occipital adenopathy present.    She has no cervical adenopathy.       Assessment & Plan:

## 2013-01-23 NOTE — Assessment & Plan Note (Signed)
Anterior blepharitis of left upper eyelid in h/o styes that required drainage. Treat with warm compresses, NSAID, and erythromycin topical ointment. If not improving or worsening, recommended eval by ophtho. Pt agrees with plan, will let us know if referral needed.

## 2013-07-28 ENCOUNTER — Encounter: Payer: Self-pay | Admitting: Family Medicine

## 2013-07-28 ENCOUNTER — Ambulatory Visit (INDEPENDENT_AMBULATORY_CARE_PROVIDER_SITE_OTHER): Payer: 59 | Admitting: Family Medicine

## 2013-07-28 VITALS — BP 116/66 | HR 81 | Temp 99.0°F | Ht 63.0 in | Wt 123.8 lb

## 2013-07-28 DIAGNOSIS — R5381 Other malaise: Secondary | ICD-10-CM

## 2013-07-28 DIAGNOSIS — D573 Sickle-cell trait: Secondary | ICD-10-CM

## 2013-07-28 DIAGNOSIS — R079 Chest pain, unspecified: Secondary | ICD-10-CM

## 2013-07-28 LAB — CBC WITH DIFFERENTIAL/PLATELET
Basophils Absolute: 0 10*3/uL (ref 0.0–0.1)
Eosinophils Absolute: 0 10*3/uL (ref 0.0–0.7)
Hemoglobin: 13.4 g/dL (ref 12.0–15.0)
Lymphocytes Relative: 30.6 % (ref 12.0–46.0)
MCHC: 33 g/dL (ref 30.0–36.0)
Neutro Abs: 4.7 10*3/uL (ref 1.4–7.7)
Neutrophils Relative %: 61.5 % (ref 43.0–77.0)
RDW: 15.2 % — ABNORMAL HIGH (ref 11.5–14.6)

## 2013-07-28 LAB — COMPREHENSIVE METABOLIC PANEL
ALT: 9 U/L (ref 0–35)
AST: 14 U/L (ref 0–37)
Albumin: 4 g/dL (ref 3.5–5.2)
Calcium: 9.4 mg/dL (ref 8.4–10.5)
Chloride: 102 mEq/L (ref 96–112)
Potassium: 4 mEq/L (ref 3.5–5.1)
Sodium: 139 mEq/L (ref 135–145)
Total Protein: 7.6 g/dL (ref 6.0–8.3)

## 2013-07-28 NOTE — Patient Instructions (Signed)
Stop at lab on way out. Start 2 tabs of prilosec 20 mg OTC daily. Follow up in 2 weeks if not improving.  Call sooner if symptoms changing.

## 2013-07-28 NOTE — Assessment & Plan Note (Signed)
EKG NSR, no ST changes, no LVH.   Eval with labs.  Unlikely cardiac cause. Possible anxiety.. But pt states not clearly associated with change in mood.  Start prilosec 40 mg daily for possible GERD cause.  Follow up in 2 weeks.

## 2013-07-28 NOTE — Assessment & Plan Note (Signed)
Eval with labs. 

## 2013-07-28 NOTE — Progress Notes (Signed)
  Subjective:    Patient ID: Erica Contreras, female    DOB: [birth year], 22 y.o.   MRN: [id number]  HPI  22 year old female with history of depression and panic attack presents with chest pain , dizziness and fatigue ongoing for several months.  Chest pain, intermittant, central, no radiation, occuring several times a day. Has increased in frequency in last few weeks. Associated with dizziness and fatigue. Occ shortness of breath. Pain happens at rest, lasts a few minutes.  Has not taken anything for it.  She has had occ pain with exercise. occ associated with anxiety, but not always as it is happening so frequently. Calming down seems to help with chest pain.  No sour taste in mouth, no heartburn.   Occ lightheaded when standing up.  She has been under less stress lately, mood is good and bad. No specific panic attacks.  Family history: MGM " hole in heart?" No CAD in family.     Review of Systems  Constitutional: Negative for fever and fatigue.  HENT: Negative for ear pain.   Eyes: Negative for pain.  Respiratory: Positive for shortness of breath. Negative for cough and chest tightness.   Cardiovascular: Positive for chest pain. Negative for palpitations and leg swelling.  Gastrointestinal: Negative for abdominal pain.  Genitourinary: Negative for dysuria.       Objective:   Physical Exam  Constitutional: Vital signs are normal. She appears well-developed and well-nourished. She is cooperative.  Non-toxic appearance. She does not appear ill. No distress.  HENT:  Head: Normocephalic.  Right Ear: Hearing, tympanic membrane, external ear and ear canal normal. Tympanic membrane is not erythematous, not retracted and not bulging.  Left Ear: Hearing, tympanic membrane, external ear and ear canal normal. Tympanic membrane is not erythematous, not retracted and not bulging.  Nose: No mucosal edema or rhinorrhea. Right sinus exhibits no maxillary sinus tenderness and no frontal  sinus tenderness. Left sinus exhibits no maxillary sinus tenderness and no frontal sinus tenderness.  Mouth/Throat: Uvula is midline, oropharynx is clear and moist and mucous membranes are normal.  Eyes: Conjunctivae, EOM and lids are normal. Pupils are equal, round, and reactive to light. Lids are everted and swept, no foreign bodies found.  Neck: Trachea normal and normal range of motion. Neck supple. Carotid bruit is not present. No mass and no thyromegaly present.  Cardiovascular: Normal rate, regular rhythm, S1 normal, S2 normal, normal heart sounds, intact distal pulses and normal pulses.  Exam reveals no gallop and no friction rub.   No murmur heard. Pulmonary/Chest: Effort normal and breath sounds normal. Not tachypneic. No respiratory distress. She has no decreased breath sounds. She has no wheezes. She has no rhonchi. She has no rales.  Abdominal: Soft. Normal appearance and bowel sounds are normal. There is no tenderness.  Neurological: She is alert.  Skin: Skin is warm, dry and intact. No rash noted.  Psychiatric: Her speech is normal and behavior is normal. Judgment and thought content normal. Her mood appears not anxious. Cognition and memory are normal. She does not exhibit a depressed mood.          Assessment & Plan:

## 2013-08-11 ENCOUNTER — Encounter: Payer: 59 | Admitting: Family Medicine

## 2013-11-07 ENCOUNTER — Encounter: Payer: Self-pay | Admitting: Family Medicine

## 2013-11-07 ENCOUNTER — Ambulatory Visit (INDEPENDENT_AMBULATORY_CARE_PROVIDER_SITE_OTHER): Payer: 59 | Admitting: Family Medicine

## 2013-11-07 VITALS — BP 100/68 | HR 71 | Temp 98.3°F | Ht 64.0 in | Wt 120.5 lb

## 2013-11-07 DIAGNOSIS — Z0000 Encounter for general adult medical examination without abnormal findings: Secondary | ICD-10-CM

## 2013-11-07 DIAGNOSIS — Z13220 Encounter for screening for lipoid disorders: Secondary | ICD-10-CM

## 2013-11-07 DIAGNOSIS — F329 Major depressive disorder, single episode, unspecified: Secondary | ICD-10-CM

## 2013-11-07 DIAGNOSIS — Z131 Encounter for screening for diabetes mellitus: Secondary | ICD-10-CM

## 2013-11-07 DIAGNOSIS — F32A Depression, unspecified: Secondary | ICD-10-CM

## 2013-11-07 DIAGNOSIS — Z113 Encounter for screening for infections with a predominantly sexual mode of transmission: Secondary | ICD-10-CM

## 2013-11-07 DIAGNOSIS — F3289 Other specified depressive episodes: Secondary | ICD-10-CM

## 2013-11-07 LAB — LIPID PANEL
CHOLESTEROL: 96 mg/dL (ref 0–200)
HDL: 37.3 mg/dL — ABNORMAL LOW (ref >39.00)
LDL Cholesterol: 49 mg/dL (ref 0–99)
Total CHOL/HDL Ratio: 3
Triglycerides: 48 mg/dL (ref 0.0–149.0)
VLDL: 9.6 mg/dL (ref 0.0–40.0)

## 2013-11-07 LAB — COMPREHENSIVE METABOLIC PANEL
ALT: 10 U/L (ref 0–35)
AST: 16 U/L (ref 0–37)
Albumin: 4.2 g/dL (ref 3.5–5.2)
Alkaline Phosphatase: 38 U/L — ABNORMAL LOW (ref 39–117)
BILIRUBIN TOTAL: 0.6 mg/dL (ref 0.3–1.2)
BUN: 12 mg/dL (ref 6–23)
CALCIUM: 9.4 mg/dL (ref 8.4–10.5)
CHLORIDE: 104 meq/L (ref 96–112)
CO2: 29 meq/L (ref 19–32)
Creatinine, Ser: 0.8 mg/dL (ref 0.4–1.2)
GFR: 115.46 mL/min (ref >60.00)
GLUCOSE: 80 mg/dL (ref 70–99)
Potassium: 4.4 mEq/L (ref 3.5–5.1)
SODIUM: 137 meq/L (ref 135–145)
TOTAL PROTEIN: 7.7 g/dL (ref 6.0–8.3)

## 2013-11-07 LAB — HEMOGLOBIN A1C: HEMOGLOBIN A1C: 5.4 % (ref 4.6–6.5)

## 2013-11-07 NOTE — Patient Instructions (Signed)
Stop at lab on way out. Continue healthy eating habits and regular exercise.

## 2013-11-07 NOTE — Progress Notes (Signed)
Pre-visit discussion using our clinic review tool. No additional management support is needed unless otherwise documented below in the visit note.  

## 2013-11-07 NOTE — Progress Notes (Signed)
   Subjective:    Patient ID: Erica Contreras, female    DOB: [birth year], 23 y.o.   MRN: [id number]  HPI  The patient is here for annual wellness exam and preventative care.    Depression, panic attacks: resolved off on no medication.  Migraine, well controlled: occuring 1 every few months.  USes tylenol of ibuprofen.       Review of Systems  Constitutional: Negative for fever, fatigue and unexpected weight change.  HENT: Negative for congestion, ear pain, sinus pressure, sneezing, sore throat and trouble swallowing.   Eyes: Negative for pain and itching.  Respiratory: Negative for cough, shortness of breath and wheezing.   Cardiovascular: Negative for chest pain, palpitations and leg swelling.  Gastrointestinal: Negative for nausea, abdominal pain, diarrhea, constipation and blood in stool.  Genitourinary: Negative for dysuria, hematuria, vaginal discharge, difficulty urinating and menstrual problem.  Skin: Negative for rash.  Neurological: Negative for syncope, weakness, light-headedness, numbness and headaches.  Psychiatric/Behavioral: Negative for confusion and dysphoric mood. The patient is not nervous/anxious.        Objective:   Physical Exam  Constitutional: Vital signs are normal. She appears well-developed and well-nourished. She is cooperative.  Non-toxic appearance. She does not appear ill. No distress.  HENT:  Head: Normocephalic.  Right Ear: Hearing, tympanic membrane, external ear and ear canal normal.  Left Ear: Hearing, tympanic membrane, external ear and ear canal normal.  Nose: Nose normal.  Eyes: Conjunctivae, EOM and lids are normal. Pupils are equal, round, and reactive to light. Lids are everted and swept, no foreign bodies found.  Neck: Trachea normal and normal range of motion. Neck supple. Carotid bruit is not present. No mass and no thyromegaly present.  Cardiovascular: Normal rate, regular rhythm, S1 normal, S2 normal, normal heart sounds and intact  distal pulses.  Exam reveals no gallop.   No murmur heard. Pulmonary/Chest: Effort normal and breath sounds normal. No respiratory distress. She has no wheezes. She has no rhonchi. She has no rales.  Abdominal: Soft. Normal appearance and bowel sounds are normal. She exhibits no distension, no fluid wave, no abdominal bruit and no mass. There is no hepatosplenomegaly. There is no tenderness. There is no rebound, no guarding and no CVA tenderness. No hernia.  Lymphadenopathy:    She has no cervical adenopathy.    She has no axillary adenopathy.  Neurological: She is alert. She has normal strength. No cranial nerve deficit or sensory deficit.  Skin: Skin is warm, dry and intact. No rash noted.  Psychiatric: Her speech is normal and behavior is normal. Judgment normal. Her mood appears not anxious. Cognition and memory are normal. She does not exhibit a depressed mood.          Assessment & Plan:  The patient's preventative maintenance and recommended screening tests for an annual wellness exam were reviewed in full today. Brought up to date unless services declined.  Counselled on the importance of diet, exercise, and its role in overall health and mortality. The patient's FH and SH was reviewed, including their home life, tobacco status, and drug and alcohol status.   Vaccines:uptodate with Td, SE to flu shot. PAP/DVE/breast:per GYN, appt next week. IUD in place.

## 2013-11-08 LAB — HEPATITIS PANEL, ACUTE
HCV AB: NEGATIVE
HEP A IGM: NONREACTIVE
HEP B C IGM: NONREACTIVE
HEP B S AG: NEGATIVE

## 2013-11-08 LAB — RPR

## 2013-11-08 LAB — HIV ANTIBODY (ROUTINE TESTING W REFLEX): HIV: NONREACTIVE

## 2013-11-11 ENCOUNTER — Encounter: Payer: Self-pay | Admitting: Family Medicine

## 2013-11-11 NOTE — Assessment & Plan Note (Signed)
Well controlled 

## 2013-11-11 NOTE — Assessment & Plan Note (Signed)
Well controlled on no med. 

## 2014-01-15 ENCOUNTER — Ambulatory Visit (INDEPENDENT_AMBULATORY_CARE_PROVIDER_SITE_OTHER): Payer: 59 | Admitting: Surgery

## 2014-01-16 ENCOUNTER — Encounter (INDEPENDENT_AMBULATORY_CARE_PROVIDER_SITE_OTHER): Payer: Self-pay | Admitting: Surgery

## 2014-05-02 ENCOUNTER — Encounter: Payer: Self-pay | Admitting: Family Medicine

## 2014-05-02 ENCOUNTER — Ambulatory Visit (INDEPENDENT_AMBULATORY_CARE_PROVIDER_SITE_OTHER): Payer: 59 | Admitting: Family Medicine

## 2014-05-02 VITALS — BP 116/74 | HR 72 | Temp 98.6°F | Ht 64.0 in | Wt 127.5 lb

## 2014-05-02 DIAGNOSIS — G43109 Migraine with aura, not intractable, without status migrainosus: Secondary | ICD-10-CM

## 2014-05-02 DIAGNOSIS — G43111 Migraine with aura, intractable, with status migrainosus: Secondary | ICD-10-CM

## 2014-05-02 MED ORDER — AMITRIPTYLINE HCL 25 MG PO TABS: 25.0000 mg | ORAL_TABLET | Freq: Every day | ORAL | Status: DC

## 2014-05-02 MED ORDER — SUMATRIPTAN SUCCINATE 6 MG/0.5ML ~~LOC~~ SOLN
2.0000 mg | Freq: Once | SUBCUTANEOUS | Status: AC
  Administered 2014-05-02: 2.04 mg via SUBCUTANEOUS

## 2014-05-02 MED ORDER — SUMATRIPTAN SUCCINATE 50 MG PO TABS: 50.0000 mg | ORAL_TABLET | ORAL | Status: DC | PRN

## 2014-05-02 NOTE — Progress Notes (Signed)
[address] [place] [address] Phone: [phone] Fax: [phone]  Patient ID: Erica Contreras MRN: [id number], DOB: [birth year], 23 y.o. Date of Encounter: 05/02/2014  Primary Physician:  Kerby NoraAmy Bedsole, MD   Chief Complaint: Headache   Subjective:   History of Present Illness:  Erica Contreras is a 23 y.o. very pleasant female patient who presents with the following:  Migraine for 3 days. Has done excedrin migraine, tylenol, ibuprofen.  No prescriptions for prevention. (Although on chart review it looks like she has been given Topamax and a TCA in the past)  Getting migraines about every few weeks No work in 1 week. She reports in the last few months, she has missed a great amount of work. Now she feels almost crippled from migraine and severe pain with photophobia, phonophobia, nausea, generally feeling terrible.   Has tried Excedrin migraine - usually this will break it.  Misses work a lot from migraines.  Denies prior use of triptan, but Imitrex script in chart previously noted.   This morning, felt like head going to explode. Light, sound.     Past Medical History, Surgical History, Social History, Family History, Problem List, Medications, and Allergies have been reviewed and updated if relevant.  Review of Systems:  GEN: No acute illnesses, no fevers, chills. GI: + N, no appetite. Pulm: No SOB Interactive and getting along well at home.  Otherwise, ROS is as per the HPI.  Objective:   Physical Examination: BP 116/74  Pulse 72  Temp(Src) 98.6 F (37 C) (Oral)  Ht 5\' 4"  (1.626 m)  Wt 127 lb 8 oz (57.834 kg)  BMI 21.87 kg/m2   GEN: WDWN, NAD, appears relatively unwell, A & O x 3 HEENT: Atraumatic, Normocephalic. Neck supple. No masses, No LAD. Ears and Nose: No external deformity. CV: RRR, No M/G/R. No JVD. No thrill. No extra heart sounds. PULM: CTA B, no wheezes, crackles, rhonchi. No retractions. No resp. distress. No accessory muscle  use. EXTR: No c/c/e NEURO Normal gait.  PSYCH: Resting in dark room much of visit.  Laboratory and Imaging Data:  Assessment & Plan:   Intractable migraine with aura with status migrainosus  Migraine with aura and without status migrainosus, not intractable - Plan: SUMAtriptan (IMITREX) injection 2.04 mg  >40 minutes spent in face to face time with patient, >50% spent in counselling or coordination of care: I kept patient in office > 1 hour, intermittently checking on her. New trial of triptan [place] in the office. Initially she told me this did not help, and I was planning next step in care, checked on her 5-10 mins later and she was doing better. Gave Imitrex prn. Given how poorly she is doing in general, I recommended started prophylactic medication.   New Prescriptions   AMITRIPTYLINE (ELAVIL) 25 MG TABLET    Take 1 tablet (25 mg total) by mouth at bedtime.   SUMATRIPTAN (IMITREX) 50 MG TABLET    Take 1 tablet (50 mg total) by mouth every 2 (two) hours as needed for migraine or headache. May repeat in 2 hours if persists    Signed,  Karleen HampshireSpencer T. Newell Frater, MD, CAQ Sports Medicine   Discontinued Medications   No medications on file   Current Medications at Discharge:   Medication List       This list is accurate as of: 05/02/14  4:16 PM.  Always use your most recent med list.  amitriptyline 25 MG tablet  Commonly known as:  ELAVIL  Take 1 tablet (25 mg total) by mouth at bedtime.     levonorgestrel 20 MCG/24HR IUD  Commonly known as:  MIRENA  1 each by Intrauterine route once.     SUMAtriptan 50 MG tablet  Commonly known as:  IMITREX  Take 1 tablet (50 mg total) by mouth every 2 (two) hours as needed for migraine or headache. May repeat in 2 hours if persists

## 2014-05-02 NOTE — Progress Notes (Signed)
Pre visit review using our clinic review tool, if applicable. No additional management support is needed unless otherwise documented below in the visit note. 

## 2014-07-04 ENCOUNTER — Telehealth: Payer: Self-pay | Admitting: Family Medicine

## 2014-07-04 NOTE — Telephone Encounter (Signed)
error 

## 2014-08-06 ENCOUNTER — Encounter: Payer: Self-pay | Admitting: Family Medicine

## 2015-12-09 ENCOUNTER — Encounter: Payer: Self-pay | Admitting: Primary Care

## 2015-12-09 ENCOUNTER — Ambulatory Visit (INDEPENDENT_AMBULATORY_CARE_PROVIDER_SITE_OTHER): Payer: No Typology Code available for payment source | Admitting: Primary Care

## 2015-12-09 VITALS — BP 116/70 | HR 97 | Temp 100.9°F | Ht 64.0 in | Wt 134.4 lb

## 2015-12-09 DIAGNOSIS — R6889 Other general symptoms and signs: Secondary | ICD-10-CM | POA: Diagnosis not present

## 2015-12-09 DIAGNOSIS — Z20828 Contact with and (suspected) exposure to other viral communicable diseases: Secondary | ICD-10-CM | POA: Diagnosis not present

## 2015-12-09 LAB — POC INFLUENZA A&B (BINAX/QUICKVUE)
Influenza A, POC: NEGATIVE
Influenza B, POC: NEGATIVE

## 2015-12-09 MED ORDER — OSELTAMIVIR PHOSPHATE 75 MG PO CAPS: 75.0000 mg | ORAL_CAPSULE | Freq: Two times a day (BID) | ORAL | Status: DC

## 2015-12-09 NOTE — Progress Notes (Signed)
Pre visit review using our clinic review tool, if applicable. No additional management support is needed unless otherwise documented below in the visit note. 

## 2015-12-09 NOTE — Patient Instructions (Signed)
Your flu test was negative, but due to exposure to the flu and your symptoms today, I believe we should treat you.  Start Tamiflu capsules to help reduce symptoms. Take 1 capsule by mouth twice daily for 5 days.  Ensure you are staying hydrated with water.  Please notify me if you develop persistent fevers of 101, start coughing up green mucous, notice increased fatigue or weakness, or feel worse after 1 week of onset of symptoms.   It was a pleasure meeting you!

## 2015-12-09 NOTE — Progress Notes (Signed)
Subjective:    Patient ID: Erica Contreras, female    DOB: Mar 29, 1991, 25 y.o.   MRN: [id number]  HPI  Erica Contreras is a 25 year old female who presents today with a chief complaint of cough. She also reports fevers, body aches, chills, sore throat, headache. Her symptoms presented suddenly around 8 pm last night. She's been exposed to the flu recently last week by numerous co-workers and her boyfriend who was diagnosed with the flu 2 days ago. She's taken tylenol last night without improvement in symptoms. Nothing makes her symptoms better. Denies productive cough, wheezing, chest pain.  Review of Systems  Constitutional: Positive for fever, chills and fatigue.  HENT: Positive for congestion and sore throat.   Respiratory: Positive for cough. Negative for shortness of breath.   Cardiovascular: Negative for chest pain.  Musculoskeletal: Positive for myalgias.       Past Medical History  Diagnosis Date  . Allergy   . Headache(784.0)   . Abnormal Pap smear     Social History   Social History  . Marital Status: Single    Spouse Name: N/A  . Number of Children: 1  . Years of Education: N/A   Occupational History  .     Social History Main Topics  . Smoking status: Never Smoker   . Smokeless tobacco: Never Used  . Alcohol Use: No     Comment: occ  . Drug Use: No  . Sexual Activity:    Partners: Male    Birth Control/ Protection: IUD   Other Topics Concern  . Not on file   Social History Narrative   Lives with son, age 333   Exercsie: Yoga daily   Diet: fruits and veggies, water, eats yogurt, avoids fast foods   Single.   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          Past Surgical History  Procedure Laterality Date  . Wisdom tooth extraction      Family History  Problem Relation Age of Onset  . Mitral valve prolapse Mother   . Allergies Mother   . Hypertension Mother   . Alcohol abuse Father   . Hypertension Father    . Allergies Sister   . Asthma Sister   . Heart disease Maternal Grandmother     rheumatic heart disease  . Hypertension Maternal Grandmother   . Hypertension Paternal Grandmother   . Diabetes Maternal Aunt   . Kidney cancer Maternal Aunt   . Hypertension Paternal Aunt   . Deep vein thrombosis Paternal Aunt     No Known Allergies  Current Outpatient Prescriptions on File Prior to Visit  Medication Sig Dispense Refill  . levonorgestrel (MIRENA) 20 MCG/24HR IUD 1 each by Intrauterine route once.       No current facility-administered medications on file prior to visit.    BP 116/70 mmHg  Pulse 97  Temp(Src) 100.9 F (38.3 C) (Oral)  Ht 5\' 4"  (1.626 m)  Wt 134 lb 6.4 oz (60.963 kg)  BMI 23.06 kg/m2  SpO2 96%    Objective:   Physical Exam  Constitutional: She appears well-nourished. She appears ill.  HENT:  Right Ear: Tympanic membrane and ear canal normal.  Left Ear: Tympanic membrane and ear canal normal.  Nose: Right sinus exhibits no maxillary sinus tenderness and no frontal sinus tenderness. Left sinus exhibits no maxillary sinus tenderness and no frontal sinus tenderness.  Mouth/Throat: Oropharynx is  clear and moist.  Eyes: Conjunctivae are normal.  Neck: Neck supple.  Cardiovascular: Normal rate and regular rhythm.   Pulmonary/Chest: Effort normal and breath sounds normal. She has no wheezes. She has no rales.  Lymphadenopathy:    She has no cervical adenopathy.  Skin: Skin is warm and dry.          Assessment & Plan:  Flu-Like Symptoms:  Exposed to flu by co-workers last week and again by boyfriend 2 days ago. Appears ill with low grade fever in clinic today. Rapid Flu: Negative. Due to recent exposure and presentation today, will treat with Tamiflu as she's inside the 48 hour window. RX for Tamilfu 5 day course sent to pharmacy. Fluids, rest.  Return precautions provided.

## 2016-03-23 ENCOUNTER — Telehealth: Payer: Self-pay | Admitting: *Deleted

## 2016-03-23 NOTE — Telephone Encounter (Signed)
Can you talk to her about gardasil

## 2016-03-23 NOTE — Telephone Encounter (Signed)
Patient left a voicemail stating that she wants to see about getting the HPV vaccines and know that she should take them before the age of 25. Patient stated that she turns 25 next month.  Patient requested a call back about taking the vaccines.

## 2016-03-24 NOTE — Telephone Encounter (Signed)
[name] notified she can get Gardasil Vaccines.  Advised they are given at 0, 2 and 6 months and as long as she gets all 3 doses before she turns 26, they should be covered by insurance.  Appointment scheduled with Dr. Ermalene SearingBedsole on 04/03/16 at 4:00 pm since she has not been seen by her PCP in 2 years.

## 2016-04-03 ENCOUNTER — Ambulatory Visit (INDEPENDENT_AMBULATORY_CARE_PROVIDER_SITE_OTHER): Payer: No Typology Code available for payment source | Admitting: Family Medicine

## 2016-04-03 ENCOUNTER — Encounter: Payer: Self-pay | Admitting: Family Medicine

## 2016-04-03 ENCOUNTER — Ambulatory Visit (INDEPENDENT_AMBULATORY_CARE_PROVIDER_SITE_OTHER)
Admission: RE | Admit: 2016-04-03 | Discharge: 2016-04-03 | Disposition: A | Discharge status: Home / Self Care | Payer: No Typology Code available for payment source | Source: Ambulatory Visit | Attending: Family Medicine | Admitting: Family Medicine

## 2016-04-03 VITALS — BP 102/60 | HR 66 | Temp 99.0°F | Ht 63.25 in | Wt 137.2 lb

## 2016-04-03 DIAGNOSIS — M25562 Pain in left knee: Secondary | ICD-10-CM | POA: Diagnosis not present

## 2016-04-03 DIAGNOSIS — S83003A Unspecified subluxation of unspecified patella, initial encounter: Secondary | ICD-10-CM | POA: Insufficient documentation

## 2016-04-03 DIAGNOSIS — Z23 Encounter for immunization: Secondary | ICD-10-CM

## 2016-04-03 DIAGNOSIS — M25362 Other instability, left knee: Secondary | ICD-10-CM | POA: Diagnosis not present

## 2016-04-03 DIAGNOSIS — S83002A Unspecified subluxation of left patella, initial encounter: Secondary | ICD-10-CM | POA: Diagnosis not present

## 2016-04-03 HISTORY — DX: Unspecified subluxation of unspecified patella, initial encounter: S83.003A

## 2016-04-03 MED ORDER — DICLOFENAC SODIUM 75 MG PO TBEC: 75.0000 mg | DELAYED_RELEASE_TABLET | Freq: Two times a day (BID) | ORAL | Status: DC

## 2016-04-03 NOTE — Progress Notes (Signed)
   Subjective:    Patient ID: Erica Contreras, female    DOB: 1991-09-30, 25 y.o.   MRN: [id number]  HPI  25 year old female presents with new onset left knee pain. 2 days ago she had sudden onset shift of knee cap to the left.. Occurred when she stood up an turned quickly.  Kneecap disclocated laterally. Since then it is sore medially and inferiorly.  Pain with straightening or bending.,  Uncomfortable to sleep.  Pain with weight bearing.  Had some swelling that has now improved some.  She has taken ibuprofen 400 mg today.. Did not help.  Hx of chronic knee pain and back pain off and on . Saw ortho 1 month ago for sciatica.    This has happened in past once, never this knee.    She is also interested in starting HPV vaccine.   Review of Systems  Constitutional: Negative for fever and fatigue.  HENT: Negative for ear pain.   Eyes: Negative for pain.  Respiratory: Negative for chest tightness and shortness of breath.   Cardiovascular: Negative for chest pain, palpitations and leg swelling.  Gastrointestinal: Negative for abdominal pain.  Genitourinary: Negative for dysuria.       Objective:   Physical Exam  Constitutional: Vital signs are normal. She appears well-developed and well-nourished. She is cooperative.  Non-toxic appearance. She does not appear ill. No distress.  HENT:  Head: Normocephalic.  Right Ear: Hearing, tympanic membrane, external ear and ear canal normal. Tympanic membrane is not erythematous, not retracted and not bulging.  Left Ear: Hearing, tympanic membrane, external ear and ear canal normal. Tympanic membrane is not erythematous, not retracted and not bulging.  Nose: No mucosal edema or rhinorrhea. Right sinus exhibits no maxillary sinus tenderness and no frontal sinus tenderness. Left sinus exhibits no maxillary sinus tenderness and no frontal sinus tenderness.  Mouth/Throat: Uvula is midline, oropharynx is clear and moist and mucous membranes are  normal.  Eyes: Conjunctivae, EOM and lids are normal. Pupils are equal, round, and reactive to light. Lids are everted and swept, no foreign bodies found.  Neck: Trachea normal and normal range of motion. Neck supple. Carotid bruit is not present. No thyroid mass and no thyromegaly present.  Cardiovascular: Normal rate, regular rhythm, S1 normal, S2 normal, normal heart sounds, intact distal pulses and normal pulses.  Exam reveals no gallop and no friction rub.   No murmur heard. Pulmonary/Chest: Effort normal and breath sounds normal. No tachypnea. No respiratory distress. She has no decreased breath sounds. She has no wheezes. She has no rhonchi. She has no rales.  Abdominal: Soft. Normal appearance and bowel sounds are normal. There is no tenderness.  Musculoskeletal:       Left knee: She exhibits decreased range of motion and swelling. She exhibits no effusion, no ecchymosis and no deformity. Tenderness found. Medial joint line, lateral joint line and patellar tendon tenderness noted.  Pt most tender medially and inferiorly along patella   Neurological: She is alert.  Skin: Skin is warm, dry and intact. No rash noted.  Psychiatric: Her speech is normal and behavior is normal. Judgment and thought content normal. Her mood appears not anxious. Cognition and memory are normal. She does not exhibit a depressed mood.     Positive patellar apprehension test     Assessment & Plan:

## 2016-04-03 NOTE — Assessment & Plan Note (Signed)
Not clear if true dislocation that self resolved or subluxation. I  favor  Subluxation as there was no trauma involved. Eval with X-ray to rule out avulsion fracture.  Start NSAID prn pain and inflammation, patella stabilizing brace, ice and limit weight bearing.  Start home PT.   Follow up next week with Dr. Patsy Lageropland for long term management recommendations.

## 2016-04-03 NOTE — Progress Notes (Signed)
Pre visit review using our clinic review tool, if applicable. No additional management support is needed unless otherwise documented below in the visit note. 

## 2016-04-03 NOTE — Patient Instructions (Addendum)
Start diclofenac twice daily as needed for pain and inflammation. ?Limit walking, standing, impact, and repetitive bending, particularly if these activities cause pain. ?Apply ice and elevate the leg for 10 to 15 minutes four times daily. ?Use a patellar restraining brace with fabric stays during the day. Start home stretching exercises as tolerated  2 times daily  Make appt with Dr. Patsy Lageropland sports med next week for further long term treatment.  Return in  2 months for next HPV vaccine

## 2016-06-05 ENCOUNTER — Ambulatory Visit (INDEPENDENT_AMBULATORY_CARE_PROVIDER_SITE_OTHER): Payer: No Typology Code available for payment source | Admitting: Family Medicine

## 2016-06-05 DIAGNOSIS — Z23 Encounter for immunization: Secondary | ICD-10-CM

## 2016-06-09 NOTE — Progress Notes (Signed)
Pt given HPV vaccine

## 2016-11-11 ENCOUNTER — Telehealth: Payer: Self-pay

## 2016-11-11 MED ORDER — OSELTAMIVIR PHOSPHATE 75 MG PO CAPS: 75.0000 mg | ORAL_CAPSULE | Freq: Two times a day (BID) | ORAL | 0 refills | Status: DC

## 2016-11-11 NOTE — Telephone Encounter (Signed)
[name] notified as instructed by telephone.  She still would like Tamiflu sent in.  [facility] [address] [facility]

## 2016-11-11 NOTE — Telephone Encounter (Signed)
Pt left v/m; pts son has the flu and pt request tamiflu at [facility] [address] pt beginning flu like symptoms (body aches,h/a, chills and fever 102.5)today. Pt request cb. Pt has lost her job and has no ins.

## 2016-11-11 NOTE — Telephone Encounter (Signed)
Tamiflu 75 mg, 1 po bid, #10, 0 refills   She should understand that out of pocket cost is approx 250 dollars.

## 2018-04-22 ENCOUNTER — Encounter (HOSPITAL_COMMUNITY): Payer: Self-pay

## 2018-04-22 ENCOUNTER — Telehealth (HOSPITAL_COMMUNITY): Payer: Self-pay

## 2018-04-22 ENCOUNTER — Ambulatory Visit (HOSPITAL_COMMUNITY)
Admission: EM | Admit: 2018-04-22 | Discharge: 2018-04-22 | Disposition: A | Discharge status: Home / Self Care | Payer: 59 | Attending: Internal Medicine | Admitting: Internal Medicine

## 2018-04-22 DIAGNOSIS — M5489 Other dorsalgia: Secondary | ICD-10-CM | POA: Diagnosis not present

## 2018-04-22 DIAGNOSIS — R51 Headache: Secondary | ICD-10-CM

## 2018-04-22 DIAGNOSIS — M542 Cervicalgia: Secondary | ICD-10-CM

## 2018-04-22 MED ORDER — CYCLOBENZAPRINE HCL 10 MG PO TABS: 5.0000 mg | ORAL_TABLET | Freq: Every day | ORAL | 0 refills | Status: DC

## 2018-04-22 MED ORDER — KETOROLAC TROMETHAMINE 60 MG/2ML IM SOLN
60.0000 mg | Freq: Once | INTRAMUSCULAR | Status: AC
  Administered 2018-04-22: 60 mg via INTRAMUSCULAR

## 2018-04-22 MED ORDER — KETOROLAC TROMETHAMINE 60 MG/2ML IM SOLN
INTRAMUSCULAR | Status: AC
  Filled 2018-04-22: qty 2

## 2018-04-22 MED ORDER — NAPROXEN 500 MG PO TABS: 500.0000 mg | ORAL_TABLET | Freq: Two times a day (BID) | ORAL | 0 refills | Status: DC
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
## 2018-04-22 NOTE — ED Notes (Signed)
Pt requesting meds to be sent to different pharmacy 
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
## 2018-04-29 ENCOUNTER — Ambulatory Visit (INDEPENDENT_AMBULATORY_CARE_PROVIDER_SITE_OTHER)
Admission: RE | Admit: 2018-04-29 | Discharge: 2018-04-29 | Disposition: A | Discharge status: Home / Self Care | Payer: 59 | Source: Ambulatory Visit | Attending: Family Medicine | Admitting: Family Medicine

## 2018-04-29 ENCOUNTER — Encounter: Payer: Self-pay | Admitting: Family Medicine

## 2018-04-29 ENCOUNTER — Ambulatory Visit: Payer: 59 | Admitting: Family Medicine

## 2018-04-29 DIAGNOSIS — M545 Low back pain, unspecified: Secondary | ICD-10-CM

## 2018-04-29 DIAGNOSIS — M542 Cervicalgia: Secondary | ICD-10-CM

## 2018-04-29 DIAGNOSIS — S199XXA Unspecified injury of neck, initial encounter: Secondary | ICD-10-CM | POA: Diagnosis not present

## 2018-04-29 HISTORY — DX: Cervicalgia: M54.2

## 2018-04-29 MED ORDER — DICLOFENAC SODIUM 75 MG PO TBEC: 75.0000 mg | DELAYED_RELEASE_TABLET | Freq: Two times a day (BID) | ORAL | 0 refills | Status: DC
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
## 2018-04-29 NOTE — Patient Instructions (Signed)
We will call with X-ray results.  Stop naproxen.  Increase muscle relaxant to 1 full tab at bedtime.  Start diclofenac twice daily.  Start home stretching and massage.

## 2018-08-22 DIAGNOSIS — Z6822 Body mass index (BMI) 22.0-22.9, adult: Secondary | ICD-10-CM | POA: Diagnosis not present

## 2018-08-22 DIAGNOSIS — R102 Pelvic and perineal pain: Secondary | ICD-10-CM | POA: Diagnosis not present

## 2018-08-22 DIAGNOSIS — Z113 Encounter for screening for infections with a predominantly sexual mode of transmission: Secondary | ICD-10-CM | POA: Diagnosis not present

## 2018-08-22 DIAGNOSIS — R208 Other disturbances of skin sensation: Secondary | ICD-10-CM | POA: Diagnosis not present

## 2018-08-22 DIAGNOSIS — Z0000 Encounter for general adult medical examination without abnormal findings: Secondary | ICD-10-CM | POA: Diagnosis not present

## 2018-08-22 DIAGNOSIS — Z124 Encounter for screening for malignant neoplasm of cervix: Secondary | ICD-10-CM | POA: Diagnosis not present

## 2018-08-22 DIAGNOSIS — Z01419 Encounter for gynecological examination (general) (routine) without abnormal findings: Secondary | ICD-10-CM | POA: Diagnosis not present

## 2018-10-03 ENCOUNTER — Ambulatory Visit: Payer: 59 | Admitting: Internal Medicine

## 2018-10-03 ENCOUNTER — Encounter: Payer: Self-pay | Admitting: Internal Medicine

## 2018-10-03 VITALS — BP 114/76 | HR 71 | Temp 98.6°F | Wt 146.0 lb

## 2018-10-03 DIAGNOSIS — R110 Nausea: Secondary | ICD-10-CM

## 2018-10-03 DIAGNOSIS — R51 Headache: Secondary | ICD-10-CM | POA: Diagnosis not present

## 2018-10-03 DIAGNOSIS — R519 Headache, unspecified: Secondary | ICD-10-CM

## 2018-10-03 DIAGNOSIS — R52 Pain, unspecified: Secondary | ICD-10-CM | POA: Diagnosis not present

## 2018-10-03 DIAGNOSIS — R197 Diarrhea, unspecified: Secondary | ICD-10-CM

## 2018-10-03 LAB — POC INFLUENZA A&B (BINAX/QUICKVUE)
INFLUENZA A, POC: NEGATIVE
INFLUENZA B, POC: NEGATIVE

## 2018-10-03 MED ORDER — ONDANSETRON HCL 4 MG PO TABS: 4.0000 mg | ORAL_TABLET | Freq: Three times a day (TID) | ORAL | 0 refills | Status: DC | PRN

## 2018-10-03 MED ORDER — KETOROLAC TROMETHAMINE 30 MG/ML IJ SOLN: 30.0000 mg | Freq: Once | INTRAMUSCULAR | Status: AC

## 2018-10-03 NOTE — Patient Instructions (Signed)
Viral Illness, Adult °Viruses are tiny germs that can get into a person's body and cause illness. There are many different types of viruses, and they cause many types of illness. Viral illnesses can range from mild to severe. They can affect various parts of the body. °Common illnesses that are caused by a virus include colds and the flu. Viral illnesses also include serious conditions such as HIV/AIDS (human immunodeficiency virus/acquired immunodeficiency syndrome). A few viruses have been linked to certain cancers. °What are the causes? °Many types of viruses can cause illness. Viruses invade cells in your body, multiply, and cause the infected cells to malfunction or die. When the cell dies, it releases more of the virus. When this happens, you develop symptoms of the illness, and the virus continues to spread to other cells. If the virus takes over the function of the cell, it can cause the cell to divide and grow out of control, as is the case when a virus causes cancer. °Different viruses get into the body in different ways. You can get a virus by: °· Swallowing food or water that is contaminated with the virus. °· Breathing in droplets that have been coughed or sneezed into the air by an infected person. °· Touching a surface that has been contaminated with the virus and then touching your eyes, nose, or mouth. °· Being bitten by an insect or animal that carries the virus. °· Having sexual contact with a person who is infected with the virus. °· Being exposed to blood or fluids that contain the virus, either through an open cut or during a transfusion. °If a virus enters your body, your body's defense system (immune system) will try to fight the virus. You may be at higher risk for a viral illness if your immune system is weak. °What are the signs or symptoms? °Symptoms vary depending on the type of virus and the location of the cells that it invades. Common symptoms of the main types of viral illnesses  include: °Cold and flu viruses °· Fever. °· Headache. °· Sore throat. °· Muscle aches. °· Nasal congestion. °· Cough. °Digestive system (gastrointestinal) viruses °· Fever. °· Abdominal pain. °· Nausea. °· Diarrhea. °Liver viruses (hepatitis) °· Loss of appetite. °· Tiredness. °· Yellowing of the skin (jaundice). °Brain and spinal cord viruses °· Fever. °· Headache. °· Stiff neck. °· Nausea and vomiting. °· Confusion or sleepiness. °Skin viruses °· Warts. °· Itching. °· Rash. °Sexually transmitted viruses °· Discharge. °· Swelling. °· Redness. °· Rash. °How is this treated? °Viruses can be difficult to treat because they live within cells. Antibiotic medicines do not treat viruses because these drugs do not get inside cells. Treatment for a viral illness may include: °· Resting and drinking plenty of fluids. °· Medicines to relieve symptoms. These can include over-the-counter medicine for pain and fever, medicines for cough or congestion, and medicines to relieve diarrhea. °· Antiviral medicines. These drugs are available only for certain types of viruses. They may help reduce flu symptoms if taken early. There are also many antiviral medicines for hepatitis and HIV/AIDS. °Some viral illnesses can be prevented with vaccinations. A common example is the flu shot. °Follow these instructions at home: °Medicines ° °· Take over-the-counter and prescription medicines only as told by your health care provider. °· If you were prescribed an antiviral medicine, take it as told by your health care provider. Do not stop taking the medicine even if you start to feel better. °· Be aware of when   antibiotics are needed and when they are not needed. Antibiotics do not treat viruses. If your health care provider thinks that you may have a bacterial infection as well as a viral infection, you may get an antibiotic. °? Do not ask for an antibiotic prescription if you have been diagnosed with a viral illness. That will not make your  illness go away faster. °? Frequently taking antibiotics when they are not needed can lead to antibiotic resistance. When this develops, the medicine no longer works against the bacteria that it normally fights. °General instructions °· Drink enough fluids to keep your urine clear or pale yellow. °· Rest as much as possible. °· Return to your normal activities as told by your health care provider. Ask your health care provider what activities are safe for you. °· Keep all follow-up visits as told by your health care provider. This is important. °How is this prevented? °Take these actions to reduce your risk of viral infection: °· Eat a healthy diet and get enough rest. °· Wash your hands often with soap and water. This is especially important when you are in public places. If soap and water are not available, use hand sanitizer. °· Avoid close contact with friends and family who have a viral illness. °· If you travel to areas where viral gastrointestinal infection is common, avoid drinking water or eating raw food. °· Keep your immunizations up to date. Get a flu shot every year as told by your health care provider. °· Do not share toothbrushes, nail clippers, razors, or needles with other people. °· Always practice safe sex. ° °Contact a health care provider if: °· You have symptoms of a viral illness that do not go away. °· Your symptoms come back after going away. °· Your symptoms get worse. °Get help right away if: °· You have trouble breathing. °· You have a severe headache or a stiff neck. °· You have severe vomiting or abdominal pain. °This information is not intended to replace advice given to you by your health care provider. Make sure you discuss any questions you have with your health care provider. °Document Released: 01/31/2016 Document Revised: 03/04/2016 Document Reviewed: 01/31/2016 °[employer] Interactive Patient Education © 2019 [employer] ° °

## 2018-10-03 NOTE — Addendum Note (Signed)
Addended by: Roena MaladyEVONTENNO, Milarose Savich Y on: 10/03/2018 12:47 PM   Modules accepted: Orders

## 2018-10-03 NOTE — Progress Notes (Signed)
Subjective:    Patient ID: Erica Contreras, female    DOB: 28-Jun-1991, 28 y.o.   MRN: [id number]  HPI  Pt presents to the clinic today with headache, nausea, diarrhea and body aches. She reports this started 2-3 days ago. The headache is located in the forehead. She describes the pain as pressure. She does feel lightheaded at times. She denies abdominal pain or vomiting. She denies runny nose, ear pain, sore throat or cough. She denies fever or chills. She has tried Ibuprofen with some relief. She has not had sick contacts but was at the hospital over the weekend visiting. She does not take a flu shot.   Review of Systems      Past Medical History:  Diagnosis Date  . Abnormal Pap smear   . Allergy   . Headache(784.0)     No current outpatient medications on file.   No current facility-administered medications for this visit.     No Known Allergies  Family History  Problem Relation Age of Onset  . Mitral valve prolapse Mother   . Allergies Mother   . Hypertension Mother   . Alcohol abuse Father   . Hypertension Father   . Allergies Sister   . Asthma Sister   . Heart disease Maternal Grandmother        rheumatic heart disease  . Hypertension Maternal Grandmother   . Hypertension Paternal Grandmother   . Diabetes Maternal Aunt   . Kidney cancer Maternal Aunt   . Hypertension Paternal Aunt   . Deep vein thrombosis Paternal Aunt     Social History   Socioeconomic History  . Marital status: Single    Spouse name: Not on file  . Number of children: 1  . Years of education: Not on file  . Highest education level: Not on file  Occupational History    Employer: [employer]  Social Needs  . Financial resource strain: Not on file  . Food insecurity:    Worry: Not on file    Inability: Not on file  . Transportation needs:    Medical: Not on file    Non-medical: Not on file  Tobacco Use  . Smoking status: Never Smoker  . Smokeless tobacco: Never Used  Substance and  Sexual Activity  . Alcohol use: No    Comment: occ  . Drug use: No  . Sexual activity: Yes    Partners: Male    Birth control/protection: I.U.D.  Lifestyle  . Physical activity:    Days per week: Not on file    Minutes per session: Not on file  . Stress: Not on file  Relationships  . Social connections:    Talks on phone: Not on file    Gets together: Not on file    Attends religious service: Not on file    Active member of club or organization: Not on file    Attends meetings of clubs or organizations: Not on file    Relationship status: Not on file  . Intimate partner violence:    Fear of current or ex partner: Not on file    Emotionally abused: Not on file    Physically abused: Not on file    Forced sexual activity: Not on file  Other Topics Concern  . Not on file  Social History Narrative   Lives with son, age 673   Exercsie: Yoga daily   Diet: fruits and veggies, water, eats yogurt, avoids fast foods   Single.  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           Constitutional: Pt reports headache and body aches. Denies fever, malaise, fatigue, or abrupt weight changes.  HEENT: Denies eye pain, eye redness, ear pain, ringing in the ears, wax buildup, runny nose, nasal congestion, bloody nose, or sore throat. Respiratory: Denies difficulty breathing, shortness of breath, cough or sputum production.   Cardiovascular: Denies chest pain, chest tightness, palpitations or swelling in the hands or feet.  Gastrointestinal: Pt reports nausea, diarrhea. Denies abdominal pain, bloating, constipation, or blood in the stool.    No other specific complaints in a complete review of systems (except as listed in HPI above).  Objective:   Physical Exam  BP 114/76   Pulse 71   Temp 98.6 F (37 C) (Oral)   Wt 146 lb (66.2 kg)   LMP 09/25/2018   SpO2 97%   BMI 25.66 kg/m  Wt Readings from Last 3 Encounters:  10/03/18 146 lb (66.2 kg)    04/29/18 143 lb (64.9 kg)  04/03/16 137 lb 4 oz (62.3 kg)    General: Appears her stated age, in NAD. Skin: Warm, dry and intact. No rashes noted. HEENT: Head: normal shape and size, no sinus tenderness noted; Ears: Tm's gray and intact, normal light reflex; Nose: mucosa pink and moist, septum midline; Throat/Mouth: Teeth present, mucosa pink and moist, no exudate, lesions or ulcerations noted.  Neck:  No adenopathy noted Cardiovascular: Normal rate and rhythm. S1,S2 noted.  No murmur, rubs or gallops noted.  Pulmonary/Chest: Normal effort and positive vesicular breath sounds. No respiratory distress. No wheezes, rales or ronchi noted.  Abdomen: Soft and nontender. Normal bowel sounds. No distention or masses noted. Musculoskeletal: Normal flexion, extension and rotation of the spine. No bony tenderness noted over the spine. Negative Kernig's. Neurological: Alert and oriented.  Coordination normal.    BMET    Component Value Date/Time   NA 137 11/07/2013 1232   K 4.4 11/07/2013 1232   CL 104 11/07/2013 1232   CO2 29 11/07/2013 1232   GLUCOSE 80 11/07/2013 1232   BUN 12 11/07/2013 1232   CREATININE 0.8 11/07/2013 1232   CALCIUM 9.4 11/07/2013 1232    Lipid Panel     Component Value Date/Time   CHOL 96 11/07/2013 1232   TRIG 48.0 11/07/2013 1232   HDL 37.30 (L) 11/07/2013 1232   CHOLHDL 3 11/07/2013 1232   VLDL 9.6 11/07/2013 1232   LDLCALC 49 11/07/2013 1232    CBC    Component Value Date/Time   WBC 7.7 07/28/2013 1152   RBC 4.88 07/28/2013 1152   HGB 13.4 07/28/2013 1152   HCT 40.4 07/28/2013 1152   PLT 259.0 07/28/2013 1152   MCV 82.8 07/28/2013 1152   MCH 28.8 11/04/2010 0503   MCHC 33.0 07/28/2013 1152   RDW 15.2 (H) 07/28/2013 1152   LYMPHSABS 2.3 07/28/2013 1152   MONOABS 0.5 07/28/2013 1152   EOSABS 0.0 07/28/2013 1152   BASOSABS 0.0 07/28/2013 1152    Hgb A1C Lab Results  Component Value Date   HGBA1C 5.4 11/07/2013            Assessment &  Plan:   Headache, Nausea, Diarrhea, Body Aches:  Rapid flu: negative Likely viral illness Toradol 30 mg IM today for headache and body aches Continue Ibuprofen prn RX for Zofran for nausea Get some rest and push fluids  Return precautions discussed Erica Reaperegina Sameera Betton, NP

## 2019-01-29 DIAGNOSIS — N390 Urinary tract infection, site not specified: Secondary | ICD-10-CM | POA: Diagnosis not present

## 2019-05-17 ENCOUNTER — Other Ambulatory Visit: Payer: Self-pay

## 2019-05-17 ENCOUNTER — Encounter: Payer: Self-pay | Admitting: Family Medicine

## 2019-05-17 ENCOUNTER — Ambulatory Visit: Payer: 59 | Admitting: Family Medicine

## 2019-05-17 VITALS — BP 100/70 | HR 67 | Temp 98.3°F | Ht 63.25 in | Wt 150.1 lb

## 2019-05-17 DIAGNOSIS — H6123 Impacted cerumen, bilateral: Secondary | ICD-10-CM | POA: Diagnosis not present

## 2019-05-17 NOTE — Progress Notes (Signed)
   Subjective:    Patient ID: Erica Contreras, female    DOB: 1991/02/17, 28 y.o.   MRN: [id number]  HPI This is a 28 yo female who presents today with decreased hearing in her ears, right greater than left.  She reports that she annually get cerumen impaction and requires irrigation.  She denies any ear pain or drainage.  Past Medical History:  Diagnosis Date  . Abnormal Pap smear   . Allergy   . QQPYPPJK(932.6)    Past Surgical History:  Procedure Laterality Date  . WISDOM TOOTH EXTRACTION     Family History  Problem Relation Age of Onset  . Mitral valve prolapse Mother   . Allergies Mother   . Hypertension Mother   . Alcohol abuse Father   . Hypertension Father   . Allergies Sister   . Asthma Sister   . Heart disease Maternal Grandmother        rheumatic heart disease  . Hypertension Maternal Grandmother   . Hypertension Paternal Grandmother   . Diabetes Maternal Aunt   . Kidney cancer Maternal Aunt   . Hypertension Paternal Aunt   . Deep vein thrombosis Paternal Aunt    Social History   Tobacco Use  . Smoking status: Never Smoker  . Smokeless tobacco: Never Used  Substance Use Topics  . Alcohol use: No    Comment: occ  . Drug use: No      Review of Systems    Per HPI Objective:   Physical Exam Vitals signs reviewed.  Constitutional:      General: She is not in acute distress.    Appearance: Normal appearance. She is normal weight. She is not ill-appearing, toxic-appearing or diaphoretic.  HENT:     Head: Normocephalic and atraumatic.     Right Ear: External ear normal. There is impacted cerumen.     Left Ear: External ear normal. There is impacted cerumen.  Cardiovascular:     Rate and Rhythm: Normal rate.  Pulmonary:     Effort: Pulmonary effort is normal.  Skin:    General: Skin is warm and dry.  Neurological:     Mental Status: She is alert and oriented to person, place, and time.  Psychiatric:        Mood and Affect: Mood normal.      Behavior: Behavior normal.        Thought Content: Thought content normal.        Judgment: Judgment normal.     Bilateral ear canals instilled with Debrox.  Irrigation performed with room temperature tap water.  Patient tolerated well.  Some removal of cerumen.  BP 100/70 (BP Location: Left Arm, Patient Position: Sitting, Cuff Size: Normal)   Pulse 67   Temp 98.3 F (36.8 C) (Temporal)   Ht 5' 3.25" (1.607 m)   Wt 150 lb 1.9 oz (68.1 kg)   SpO2 96%   BMI 26.38 kg/m  Wt Readings from Last 3 Encounters:  05/17/19 150 lb 1.9 oz (68.1 kg)  10/03/18 146 lb (66.2 kg)  04/29/18 143 lb (64.9 kg)       Assessment & Plan:  1. Bilateral impacted cerumen -Provided patient with information regarding cerumen impaction and ways to decrease this -Return to clinic precautions reviewed, fever, pain, drainage   Clarene Reamer, FNP-BC  [facility], [place]  05/17/2019 11:56 AM

## 2019-05-17 NOTE — Patient Instructions (Signed)
Good to meet you today  Avoid using cotton swabs inserted into the ear canal  Can try one of the following to help keep ear wax from building up-   May use cotton ball soaked in mineral oil into ear 10-20 min per week if having recurrent ear wax accumulation.May use dilute hydrogen peroxide (equal parts this and water) and place a few drops into ear every 2 weeks to help break down wax buildup.    Earwax Buildup, Adult The ears produce a substance called earwax that helps keep bacteria out of the ear and protects the skin in the ear canal. Occasionally, earwax can build up in the ear and cause discomfort or hearing loss. What increases the risk? This condition is more likely to develop in people who:  Are female.  Are elderly.  Naturally produce more earwax.  Clean their ears often with cotton swabs.  Use earplugs often.  Use in-ear headphones often.  Wear hearing aids.  Have narrow ear canals.  Have earwax that is overly thick or sticky.  Have eczema.  Are dehydrated.  Have excess hair in the ear canal. What are the signs or symptoms? Symptoms of this condition include:  Reduced or muffled hearing.  A feeling of fullness in the ear or feeling that the ear is plugged.  Fluid coming from the ear.  Ear pain.  Ear itch.  Ringing in the ear.  Coughing.  An obvious piece of earwax that can be seen inside the ear canal. How is this diagnosed? This condition may be diagnosed based on:  Your symptoms.  Your medical history.  An ear exam. During the exam, your health care provider will look into your ear with an instrument called an otoscope. You may have tests, including a hearing test. How is this treated? This condition may be treated by:  Using ear drops to soften the earwax.  Having the earwax removed by a health care provider. The health care provider may: ? Flush the ear with water. ? Use an instrument that has a loop on the end (curette). ? Use a  suction device.  Surgery to remove the wax buildup. This may be done in severe cases. Follow these instructions at home:   Take over-the-counter and prescription medicines only as told by your health care provider.  Do not put any objects, including cotton swabs, into your ear. You can clean the opening of your ear canal with a washcloth or facial tissue.  Follow instructions from your health care provider about cleaning your ears. Do not over-clean your ears.  Drink enough fluid to keep your urine clear or pale yellow. This will help to thin the earwax.  Keep all follow-up visits as told by your health care provider. If earwax builds up in your ears often or if you use hearing aids, consider seeing your health care provider for routine, preventive ear cleanings. Ask your health care provider how often you should schedule your cleanings.  If you have hearing aids, clean them according to instructions from the manufacturer and your health care provider. Contact a health care provider if:  You have ear pain.  You develop a fever.  You have blood, pus, or other fluid coming from your ear.  You have hearing loss.  You have ringing in your ears that does not go away.  Your symptoms do not improve with treatment.  You feel like the room is spinning (vertigo). Summary  Earwax can build up in the ear and  cause discomfort or hearing loss.  The most common symptoms of this condition include reduced or muffled hearing and a feeling of fullness in the ear or feeling that the ear is plugged.  This condition may be diagnosed based on your symptoms, your medical history, and an ear exam.  This condition may be treated by using ear drops to soften the earwax or by having the earwax removed by a health care provider.  Do not put any objects, including cotton swabs, into your ear. You can clean the opening of your ear canal with a washcloth or facial tissue. This information is not intended  to replace advice given to you by your health care provider. Make sure you discuss any questions you have with your health care provider. Document Released: 10/29/2004 Document Revised: 09/03/2017 Document Reviewed: 12/02/2016 Elsevier Patient Education  2020 [employer].

## 2019-07-10 ENCOUNTER — Telehealth: Payer: Self-pay | Admitting: Family Medicine

## 2019-07-10 DIAGNOSIS — Z13220 Encounter for screening for lipoid disorders: Secondary | ICD-10-CM

## 2019-07-10 NOTE — Telephone Encounter (Signed)
-----   Message from Ellamae Sia sent at 07/10/2019 10:40 AM EDT ----- Regarding: Lab orders fro Monday, 10.12.20 Patient is scheduled for CPX labs, please order future labs, Thanks , Karna Christmas

## 2019-07-13 ENCOUNTER — Telehealth: Payer: Self-pay

## 2019-07-13 NOTE — Telephone Encounter (Signed)
LVM w COVID screen, back lab and front door info  

## 2019-07-17 ENCOUNTER — Other Ambulatory Visit: Payer: Self-pay

## 2019-07-17 ENCOUNTER — Other Ambulatory Visit: Payer: Medicaid Other

## 2019-07-20 ENCOUNTER — Ambulatory Visit: Payer: 59 | Admitting: Family Medicine

## 2019-08-22 ENCOUNTER — Ambulatory Visit: Payer: Medicaid Other | Admitting: Family Medicine
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
## 2020-04-25 DIAGNOSIS — Z20822 Contact with and (suspected) exposure to covid-19: Secondary | ICD-10-CM | POA: Diagnosis not present

## 2020-05-08 DIAGNOSIS — Z113 Encounter for screening for infections with a predominantly sexual mode of transmission: Secondary | ICD-10-CM | POA: Diagnosis not present

## 2020-05-08 DIAGNOSIS — Z0000 Encounter for general adult medical examination without abnormal findings: Secondary | ICD-10-CM | POA: Diagnosis not present

## 2020-05-08 DIAGNOSIS — R208 Other disturbances of skin sensation: Secondary | ICD-10-CM | POA: Diagnosis not present

## 2020-05-08 DIAGNOSIS — K429 Umbilical hernia without obstruction or gangrene: Secondary | ICD-10-CM | POA: Diagnosis not present

## 2020-05-08 DIAGNOSIS — Z01419 Encounter for gynecological examination (general) (routine) without abnormal findings: Secondary | ICD-10-CM | POA: Diagnosis not present

## 2020-05-08 DIAGNOSIS — Z6822 Body mass index (BMI) 22.0-22.9, adult: Secondary | ICD-10-CM | POA: Diagnosis not present

## 2020-09-13 DIAGNOSIS — R112 Nausea with vomiting, unspecified: Secondary | ICD-10-CM | POA: Diagnosis not present

## 2020-09-13 DIAGNOSIS — N946 Dysmenorrhea, unspecified: Secondary | ICD-10-CM | POA: Diagnosis not present

## 2020-09-13 DIAGNOSIS — R208 Other disturbances of skin sensation: Secondary | ICD-10-CM | POA: Diagnosis not present

## 2020-09-13 DIAGNOSIS — Z733 Stress, not elsewhere classified: Secondary | ICD-10-CM | POA: Diagnosis not present

## 2020-09-13 DIAGNOSIS — Z113 Encounter for screening for infections with a predominantly sexual mode of transmission: Secondary | ICD-10-CM | POA: Diagnosis not present

## 2020-09-13 DIAGNOSIS — G4700 Insomnia, unspecified: Secondary | ICD-10-CM | POA: Diagnosis not present

## 2020-09-13 DIAGNOSIS — R5383 Other fatigue: Secondary | ICD-10-CM | POA: Diagnosis not present

## 2020-09-13 DIAGNOSIS — Z634 Disappearance and death of family member: Secondary | ICD-10-CM | POA: Diagnosis not present

## 2020-10-19 DIAGNOSIS — R0602 Shortness of breath: Secondary | ICD-10-CM | POA: Diagnosis not present

## 2020-10-19 DIAGNOSIS — Z1339 Encounter for screening examination for other mental health and behavioral disorders: Secondary | ICD-10-CM | POA: Diagnosis not present

## 2020-10-19 DIAGNOSIS — Z711 Person with feared health complaint in whom no diagnosis is made: Secondary | ICD-10-CM | POA: Diagnosis not present

## 2020-10-19 DIAGNOSIS — Z1331 Encounter for screening for depression: Secondary | ICD-10-CM | POA: Diagnosis not present

## 2020-10-19 DIAGNOSIS — Z7185 Encounter for immunization safety counseling: Secondary | ICD-10-CM | POA: Diagnosis not present

## 2020-10-19 DIAGNOSIS — F419 Anxiety disorder, unspecified: Secondary | ICD-10-CM | POA: Diagnosis not present

## 2020-10-19 DIAGNOSIS — Z1159 Encounter for screening for other viral diseases: Secondary | ICD-10-CM | POA: Diagnosis not present

## 2020-10-19 DIAGNOSIS — R634 Abnormal weight loss: Secondary | ICD-10-CM | POA: Diagnosis not present

## 2020-10-19 DIAGNOSIS — Z0000 Encounter for general adult medical examination without abnormal findings: Secondary | ICD-10-CM | POA: Diagnosis not present

## 2021-02-27 DIAGNOSIS — Z113 Encounter for screening for infections with a predominantly sexual mode of transmission: Secondary | ICD-10-CM | POA: Diagnosis not present

## 2021-02-27 DIAGNOSIS — R34 Anuria and oliguria: Secondary | ICD-10-CM | POA: Diagnosis not present

## 2021-03-04 ENCOUNTER — Telehealth (INDEPENDENT_AMBULATORY_CARE_PROVIDER_SITE_OTHER): Payer: Medicaid Other | Admitting: Primary Care

## 2021-03-04 ENCOUNTER — Other Ambulatory Visit: Payer: Self-pay

## 2021-03-04 ENCOUNTER — Other Ambulatory Visit (INDEPENDENT_AMBULATORY_CARE_PROVIDER_SITE_OTHER): Payer: Medicaid Other

## 2021-03-04 ENCOUNTER — Encounter: Payer: Self-pay | Admitting: Primary Care

## 2021-03-04 VITALS — Ht 63.25 in | Wt 150.0 lb

## 2021-03-04 DIAGNOSIS — R509 Fever, unspecified: Secondary | ICD-10-CM | POA: Diagnosis not present

## 2021-03-04 LAB — POCT INFLUENZA A/B
Influenza A, POC: NEGATIVE
Influenza B, POC: NEGATIVE

## 2021-03-04 NOTE — Addendum Note (Signed)
Addended by: Donnamarie Poag on: 03/04/2021 11:34 AM   Modules accepted: Orders

## 2021-03-04 NOTE — Addendum Note (Signed)
Addended by: Doreene Nest on: 03/04/2021 11:38 AM   Modules accepted: Orders

## 2021-03-04 NOTE — Progress Notes (Addendum)
Patient ID: Erica Contreras, female    DOB: September 13, 1991, 30 y.o.   MRN: [id number]  Virtual visit completed through [employer], a video enabled telemedicine application. Due to national recommendations of social distancing due to COVID-19, a virtual visit is felt to be most appropriate for this patient at this time. Reviewed limitations, risks, security and privacy concerns of performing a virtual visit and the availability of in person appointments. I also reviewed that there may be a patient responsible charge related to this service. The patient agreed to proceed.   Patient location: home Provider location: Contreras at [facility], office Persons participating in this virtual visit: patient, provider   If any vitals were documented, they were collected by patient at home unless specified below.    Ht 5' 3.25" (1.607 m)   Wt 150 lb (68 kg)   BMI 26.36 kg/m    CC: Home Subjective:   HPI: Erica Contreras is a 30 y.o. female patient of Dr. Ermalene Searing presenting on 03/04/2021 for Cough (Symptoms started Saturday had neg covid test that day. Was in the class with pos flu kid. Congestion, fever, chills and fatigue.  )  Symptoms began three days ago and include fever, chills, fatigue, congestion. Fever yesterday ran 104.8 last night.  She was exposed to influenza one day prior to her symptom onset.    She's taken Tylenol last night and felt that she broke the fever last night. Her son tested positive for influenza. She took both a Covid and flu test the same day her symptoms began, these tests were negative. She took another rapid Covid test yesterday which was negative.  She's been at home since her symptoms began. She's not had Covid or flu vaccines.       Relevant past medical, surgical, family and social history reviewed and updated as indicated. Interim medical history since our last visit reviewed. Allergies and medications reviewed and updated. No outpatient medications prior to visit.    No facility-administered medications prior to visit.     Per HPI unless specifically indicated in ROS section below Review of Systems  Constitutional: Positive for chills, fatigue and fever.  HENT: Positive for congestion and sinus pressure.   Respiratory: Positive for cough. Negative for shortness of breath.    Objective:  Ht 5' 3.25" (1.607 m)   Wt 150 lb (68 kg)   BMI 26.36 kg/m   Wt Readings from Last 3 Encounters:  03/04/21 150 lb (68 kg)  05/17/19 150 lb 1.9 oz (68.1 kg)  10/03/18 146 lb (66.2 kg)       Physical exam: Gen: alert, NAD, appears tired Pulm: speaks in complete sentences without increased work of breathing, no cough during visit. Psych: normal mood, normal thought content      Results for orders placed or performed in visit on 10/03/18  POC Influenza A&B(BINAX/QUICKVUE)  Result Value Ref Range   Influenza A, POC Negative Negative   Influenza B, POC Negative Negative   Assessment & Plan:   Problem List Items Addressed This Visit      Other   Fever with exposure to COVID-19 virus - Primary    Suspicious for Covid-19 infection, will also recheck for influenza. She will come later today for testing.  Discussed home care instructions, quarantine period, etc. She appears stable today.      Relevant Orders   Novel Coronavirus, NAA ([facility])   Influenza A/B       No orders of the defined types  were placed in this encounter.  Orders Placed This Encounter  Procedures  . Novel Coronavirus, NAA ([facility])    Standing Status:   Future    Standing Expiration Date:   03/04/2022    Order Specific Question:   Is this test for diagnosis or screening    Answer:   Diagnosis of ill patient    Order Specific Question:   Symptomatic for COVID-19 as defined by CDC    Answer:   Yes    Order Specific Question:   Date of Symptom Onset    Answer:   03/01/2021    Order Specific Question:   Hospitalized for COVID-19    Answer:   No    Order Specific Question:    Admitted to ICU for COVID-19    Answer:   No    Order Specific Question:   Previously tested for COVID-19    Answer:   No    Order Specific Question:   Resident in a congregate (group) care setting    Answer:   No    Order Specific Question:   Is the patient student?    Answer:   No    Order Specific Question:   Employed in healthcare setting    Answer:   No    Order Specific Question:   Pregnant    Answer:   No    Order Specific Question:   Has patient completed COVID vaccination(s) (2 doses of Pfizer/Moderna 1 dose of [employer])    Answer:   No  . Influenza A/B    Standing Status:   Future    Standing Expiration Date:   04/03/2021    I discussed the assessment and treatment plan with the patient. The patient was provided an opportunity to ask questions and all were answered. The patient agreed with the plan and demonstrated an understanding of the instructions. The patient was advised to call back or seek an in-person evaluation if the symptoms worsen or if the condition fails to improve as anticipated.  Follow up plan:  Come this afternoon for testing.  It was a pleasure meeting you!   Doreene Nest, NP

## 2021-03-04 NOTE — Assessment & Plan Note (Addendum)
Suspicious for influenza, will also recheck for Covid-19. She will come later today for testing.  Discussed home care instructions, quarantine period, etc. She appears stable today.

## 2021-03-05 LAB — SARS-COV-2, NAA 2 DAY TAT

## 2021-03-05 LAB — NOVEL CORONAVIRUS, NAA: SARS-CoV-2, NAA: DETECTED — AB

## 2021-03-11 ENCOUNTER — Encounter: Payer: Self-pay | Admitting: Primary Care

## 2021-05-12 DIAGNOSIS — Z113 Encounter for screening for infections with a predominantly sexual mode of transmission: Secondary | ICD-10-CM | POA: Diagnosis not present

## 2021-05-12 DIAGNOSIS — Z01419 Encounter for gynecological examination (general) (routine) without abnormal findings: Secondary | ICD-10-CM | POA: Diagnosis not present

## 2021-05-12 DIAGNOSIS — Z681 Body mass index (BMI) 19 or less, adult: Secondary | ICD-10-CM | POA: Diagnosis not present

## 2021-05-12 DIAGNOSIS — Z124 Encounter for screening for malignant neoplasm of cervix: Secondary | ICD-10-CM | POA: Diagnosis not present

## 2021-05-12 DIAGNOSIS — Z1889 Other specified retained foreign body fragments: Secondary | ICD-10-CM | POA: Diagnosis not present

## 2021-05-12 DIAGNOSIS — Z0000 Encounter for general adult medical examination without abnormal findings: Secondary | ICD-10-CM | POA: Diagnosis not present

## 2021-05-12 DIAGNOSIS — R87810 Cervical high risk human papillomavirus (HPV) DNA test positive: Secondary | ICD-10-CM | POA: Diagnosis not present

## 2021-06-24 DIAGNOSIS — S161XXA Strain of muscle, fascia and tendon at neck level, initial encounter: Secondary | ICD-10-CM | POA: Diagnosis not present

## 2021-08-13 DIAGNOSIS — A599 Trichomoniasis, unspecified: Secondary | ICD-10-CM | POA: Diagnosis not present

## 2021-08-13 DIAGNOSIS — Z113 Encounter for screening for infections with a predominantly sexual mode of transmission: Secondary | ICD-10-CM | POA: Diagnosis not present

## 2021-08-13 DIAGNOSIS — N898 Other specified noninflammatory disorders of vagina: Secondary | ICD-10-CM | POA: Diagnosis not present

## 2022-05-22 DIAGNOSIS — R87810 Cervical high risk human papillomavirus (HPV) DNA test positive: Secondary | ICD-10-CM | POA: Diagnosis not present

## 2022-05-22 DIAGNOSIS — Z01419 Encounter for gynecological examination (general) (routine) without abnormal findings: Secondary | ICD-10-CM | POA: Diagnosis not present

## 2022-05-22 DIAGNOSIS — N951 Menopausal and female climacteric states: Secondary | ICD-10-CM | POA: Diagnosis not present

## 2022-05-22 DIAGNOSIS — Z6826 Body mass index (BMI) 26.0-26.9, adult: Secondary | ICD-10-CM | POA: Diagnosis not present

## 2022-05-22 DIAGNOSIS — Z113 Encounter for screening for infections with a predominantly sexual mode of transmission: Secondary | ICD-10-CM | POA: Diagnosis not present

## 2022-06-16 DIAGNOSIS — R87810 Cervical high risk human papillomavirus (HPV) DNA test positive: Secondary | ICD-10-CM | POA: Diagnosis not present

## 2022-06-16 DIAGNOSIS — R87610 Atypical squamous cells of undetermined significance on cytologic smear of cervix (ASC-US): Secondary | ICD-10-CM | POA: Diagnosis not present

## 2022-07-06 DIAGNOSIS — Z23 Encounter for immunization: Secondary | ICD-10-CM | POA: Diagnosis not present

## 2022-08-05 DIAGNOSIS — Z23 Encounter for immunization: Secondary | ICD-10-CM | POA: Diagnosis not present

## 2022-09-22 DIAGNOSIS — M129 Arthropathy, unspecified: Secondary | ICD-10-CM | POA: Diagnosis not present

## 2022-09-22 DIAGNOSIS — Z0000 Encounter for general adult medical examination without abnormal findings: Secondary | ICD-10-CM | POA: Diagnosis not present

## 2022-09-22 DIAGNOSIS — Z7185 Encounter for immunization safety counseling: Secondary | ICD-10-CM | POA: Diagnosis not present

## 2022-09-22 DIAGNOSIS — Z5320 Procedure and treatment not carried out because of patient's decision for unspecified reasons: Secondary | ICD-10-CM | POA: Diagnosis not present

## 2022-09-22 DIAGNOSIS — F419 Anxiety disorder, unspecified: Secondary | ICD-10-CM | POA: Diagnosis not present

## 2022-09-22 DIAGNOSIS — Z6827 Body mass index (BMI) 27.0-27.9, adult: Secondary | ICD-10-CM | POA: Diagnosis not present

## 2022-09-22 DIAGNOSIS — Z20822 Contact with and (suspected) exposure to covid-19: Secondary | ICD-10-CM | POA: Diagnosis not present

## 2022-09-22 DIAGNOSIS — R0602 Shortness of breath: Secondary | ICD-10-CM | POA: Diagnosis not present

## 2022-09-22 DIAGNOSIS — Z1331 Encounter for screening for depression: Secondary | ICD-10-CM | POA: Diagnosis not present

## 2022-09-22 DIAGNOSIS — Z1339 Encounter for screening examination for other mental health and behavioral disorders: Secondary | ICD-10-CM | POA: Diagnosis not present

## 2022-09-22 DIAGNOSIS — Z1159 Encounter for screening for other viral diseases: Secondary | ICD-10-CM | POA: Diagnosis not present

## 2022-10-20 DIAGNOSIS — M129 Arthropathy, unspecified: Secondary | ICD-10-CM | POA: Diagnosis not present

## 2022-10-20 DIAGNOSIS — J302 Other seasonal allergic rhinitis: Secondary | ICD-10-CM | POA: Diagnosis not present

## 2022-10-20 DIAGNOSIS — Z6827 Body mass index (BMI) 27.0-27.9, adult: Secondary | ICD-10-CM | POA: Diagnosis not present

## 2022-10-20 DIAGNOSIS — E786 Lipoprotein deficiency: Secondary | ICD-10-CM | POA: Diagnosis not present

## 2022-10-20 DIAGNOSIS — F419 Anxiety disorder, unspecified: Secondary | ICD-10-CM | POA: Diagnosis not present

## 2023-01-06 DIAGNOSIS — Z23 Encounter for immunization: Secondary | ICD-10-CM | POA: Diagnosis not present

## 2023-02-10 ENCOUNTER — Telehealth: Payer: Self-pay

## 2023-02-10 NOTE — Telephone Encounter (Signed)
LVM for patient to call back. AS< CMA 

## 2023-07-12 DIAGNOSIS — R300 Dysuria: Secondary | ICD-10-CM | POA: Diagnosis not present

## 2023-08-11 ENCOUNTER — Ambulatory Visit (INDEPENDENT_AMBULATORY_CARE_PROVIDER_SITE_OTHER): Payer: BC Managed Care – PPO | Admitting: Primary Care

## 2023-08-11 ENCOUNTER — Encounter: Payer: Self-pay | Admitting: Primary Care

## 2023-08-11 VITALS — BP 126/82 | HR 105 | Temp 97.3°F | Ht 63.25 in | Wt 168.0 lb

## 2023-08-11 DIAGNOSIS — H9193 Unspecified hearing loss, bilateral: Secondary | ICD-10-CM

## 2023-08-11 DIAGNOSIS — G43009 Migraine without aura, not intractable, without status migrainosus: Secondary | ICD-10-CM

## 2023-08-11 DIAGNOSIS — F3342 Major depressive disorder, recurrent, in full remission: Secondary | ICD-10-CM | POA: Diagnosis not present

## 2023-08-11 DIAGNOSIS — F411 Generalized anxiety disorder: Secondary | ICD-10-CM | POA: Diagnosis not present

## 2023-08-11 NOTE — Progress Notes (Signed)
Subjective:    Patient ID: Erica Contreras, female    DOB: [birth year], 32 y.o.   MRN: [id number]  HPI  Erica Contreras is a very pleasant 32 y.o. female who presents to transfer care.   1) Migraines: Chronic since her high school years. Migraines are infrequent overall, occur 1-3 times annually. She typically takes Tylenol or Ibuprofen and sleep with resolve.   2) Depression/Anxiety: Chronic for years. Overall, depression has improved but continues to experience anxiety. Symptoms include feeling panicked with sweating to her hands. Feels better now that she's switched jobs.   3) Decreased Hearing: Occurs to bilateral ears, chronic over the last 3-6 months. She flys frequently for work, questions if her frequent travel is the cause. She works in [employer], takes flights several times monthly.   She's noticed that she cannot hear her son as well. She's also noticed others complain about loud noise where she does not notice.  She denies exposure to loud music or noises as a child or teenager.   Review of Systems  HENT:  Negative for ear pain.        Decreased ability to hear bilaterally  Respiratory:  Negative for shortness of breath.   Cardiovascular:  Negative for chest pain.  Neurological:  Negative for headaches.  Psychiatric/Behavioral:  The patient is nervous/anxious.          Past Medical History:  Diagnosis Date   Abnormal Pap smear    Acute neck pain 04/29/2018   Allergy    Dysmenorrhea 08/02/2007   Qualifier: Diagnosis of   By: Ermalene Searing MD, Amy         Headache(784.0)    Hx of chlamydia infection 02/22/2012   Hx of gonorrhea 02/22/2012   HYPERHIDROSIS 08/02/2007   Qualifier: Diagnosis of   By: Ermalene Searing MD, Amy         Patellar subluxation 04/03/2016    Social History   Socioeconomic History   Marital status: Single    Spouse name: Not on file   Number of children: 1   Years of education: Not on file   Highest education level: Not on file  Occupational History     Employer: [employer]  Tobacco Use   Smoking status: Never   Smokeless tobacco: Never  Substance and Sexual Activity   Alcohol use: No    Comment: occ   Drug use: No   Sexual activity: Yes    Partners: Male    Birth control/protection: I.U.D.  Other Topics Concern   Not on file  Social History Narrative   Lives with son, age 40   Exercsie: Yoga daily   Diet: fruits and veggies, water, eats yogurt, avoids fast foods   Single.   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         Social Determinants of Health   Financial Resource Strain: Not on file  Food Insecurity: Not on file  Transportation Needs: Not on file  Physical Activity: Not on file  Stress: Not on file  Social Connections: Not on file  Intimate Partner Violence: Not on file    Past Surgical History:  Procedure Laterality Date   WISDOM TOOTH EXTRACTION      Family History  Problem Relation Age of Onset   Mitral valve prolapse Mother    Allergies Mother    Hypertension Mother    Alcohol abuse Father    Hypertension Father    Allergies Sister  Asthma Sister    Heart disease Maternal Grandmother        rheumatic heart disease   Hypertension Maternal Grandmother    Hypertension Paternal Grandmother    Diabetes Maternal Aunt    Kidney cancer Maternal Aunt    Hypertension Paternal Aunt    Deep vein thrombosis Paternal Aunt     No Known Allergies  No current outpatient medications on file prior to visit.   No current facility-administered medications on file prior to visit.    BP 126/82   Pulse (!) 105   Temp (!) 97.3 F (36.3 C) (Temporal)   Ht 5' 3.25" (1.607 m)   Wt 168 lb (76.2 kg)   LMP 07/18/2023   SpO2 99%   BMI 29.53 kg/m  Objective:   Physical Exam HENT:     Right Ear: Tympanic membrane and ear canal normal. There is no impacted cerumen.     Left Ear: Tympanic membrane and ear canal normal. There is no impacted cerumen.  Cardiovascular:      Rate and Rhythm: Normal rate and regular rhythm.  Pulmonary:     Effort: Pulmonary effort is normal.     Breath sounds: Normal breath sounds.  Musculoskeletal:     Cervical back: Neck supple.  Skin:    General: Skin is warm and dry.  Neurological:     Mental Status: She is alert and oriented to person, place, and time.  Psychiatric:        Mood and Affect: Mood normal.           Assessment & Plan:  Decreased hearing of both ears Assessment & Plan: No abnormality or cerumen impaction on exam.  Referral placed to ENT for formal hearing evaluation.  Orders: -     Ambulatory referral to ENT  Recurrent major depressive disorder, in full remission [facility]) Assessment & Plan: Denies concerns today. Continue to monitor.   Generalized anxiety disorder Assessment & Plan: Stable per patient. No concerns today.  Continue to monitor.   Migraine without aura and without status migrainosus, not intractable Assessment & Plan: Controlled.  Continue Tylenol/ibuprofen as needed for which she uses infrequently.  Continue monitor.         Doreene Nest, NP

## 2023-08-11 NOTE — Assessment & Plan Note (Signed)
Denies concerns today. °Continue to monitor. °

## 2023-08-11 NOTE — Assessment & Plan Note (Signed)
No abnormality or cerumen impaction on exam.  Referral placed to ENT for formal hearing evaluation.

## 2023-08-11 NOTE — Assessment & Plan Note (Signed)
Stable per patient. No concerns today.  Continue to monitor.

## 2023-08-11 NOTE — Patient Instructions (Signed)
You will either be contacted via phone regarding your referral to ENT, or you may receive a letter on your MyChart portal from our referral team with instructions for scheduling an appointment. Please let us know if you have not been contacted by anyone within two weeks.  It was a pleasure to see you today!

## 2023-08-11 NOTE — Assessment & Plan Note (Signed)
Controlled.  Continue Tylenol/ibuprofen as needed for which she uses infrequently.  Continue monitor.

## 2023-08-17 ENCOUNTER — Encounter (INDEPENDENT_AMBULATORY_CARE_PROVIDER_SITE_OTHER): Payer: Self-pay | Admitting: Otolaryngology

## 2023-09-16 ENCOUNTER — Institutional Professional Consult (permissible substitution) (INDEPENDENT_AMBULATORY_CARE_PROVIDER_SITE_OTHER): Payer: BC Managed Care – PPO
